# Patient Record
Sex: Female | Born: 1955 | Race: White | Hispanic: No | Marital: Married | Smoking: Former smoker
Health system: Southern US, Community
[De-identification: ages and names within clinical notes are randomized; demographics above are authoritative.]

## PROBLEM LIST (undated history)

## (undated) DIAGNOSIS — J449 Chronic obstructive pulmonary disease, unspecified: Secondary | ICD-10-CM

## (undated) DIAGNOSIS — I10 Essential (primary) hypertension: Secondary | ICD-10-CM

## (undated) HISTORY — PX: TUBAL LIGATION: SHX77

## (undated) HISTORY — PX: CHOLECYSTECTOMY: SHX55

---

## 2010-09-11 DIAGNOSIS — H6120 Impacted cerumen, unspecified ear: Secondary | ICD-10-CM | POA: Insufficient documentation

## 2010-11-24 ENCOUNTER — Encounter (HOSPITAL_BASED_OUTPATIENT_CLINIC_OR_DEPARTMENT_OTHER)
Admission: RE | Admit: 2010-11-24 | Discharge: 2010-11-24 | Disposition: A | Discharge status: Home / Self Care | Payer: BC Managed Care – PPO | Source: Ambulatory Visit | Attending: Orthopedic Surgery | Admitting: Orthopedic Surgery

## 2010-11-24 DIAGNOSIS — Z01810 Encounter for preprocedural cardiovascular examination: Secondary | ICD-10-CM | POA: Insufficient documentation

## 2010-11-24 DIAGNOSIS — Z01812 Encounter for preprocedural laboratory examination: Secondary | ICD-10-CM | POA: Insufficient documentation

## 2010-11-24 LAB — BASIC METABOLIC PANEL
CO2: 29 mEq/L (ref 19–32)
Chloride: 108 mEq/L (ref 96–112)
GFR calc Af Amer: >60 mL/min (ref >60)
Glucose, Bld: 88 mg/dL (ref 70–99)
Potassium: 4.3 mEq/L (ref 3.5–5.1)
Sodium: 142 mEq/L (ref 135–145)

## 2010-11-27 ENCOUNTER — Ambulatory Visit (HOSPITAL_BASED_OUTPATIENT_CLINIC_OR_DEPARTMENT_OTHER)
Admission: RE | Admit: 2010-11-27 | Discharge: 2010-11-27 | Disposition: A | Discharge status: Home / Self Care | Payer: BC Managed Care – PPO | Source: Ambulatory Visit | Attending: Orthopedic Surgery | Admitting: Orthopedic Surgery

## 2010-11-27 DIAGNOSIS — M6530 Trigger finger, unspecified finger: Secondary | ICD-10-CM | POA: Insufficient documentation

## 2010-11-27 DIAGNOSIS — Z01818 Encounter for other preprocedural examination: Secondary | ICD-10-CM | POA: Insufficient documentation

## 2010-12-10 NOTE — Op Note (Signed)
  NAMEBELLARAE, LIZER NO.:  [id number]  MEDICAL RECORD NO.:  [id number]          PATIENT TYPE:  LOCATION:                                 FACILITY:  PHYSICIAN:  Loreta Ave, M.D. DATE OF BIRTH:  [birth year]  DATE OF PROCEDURE:  11/27/2010 DATE OF DISCHARGE:                              OPERATIVE REPORT   PREOPERATIVE DIAGNOSIS:  Mark triggering A1 pulley, right thumb.  POSTOPERATIVE DIAGNOSIS:  Mark triggering A1 pulley, right thumb.  PROCEDURE:  Release of A1 pulley, right thumb.  SURGEON:  Loreta Ave, MD  ASSISTANT:  Genene Churn. Barry Dienes, [place]  ANESTHESIA:  General  BLOOD LOSS:  Minimal.  SPECIMEN:  None.  CULTURES:  None.  COMPLICATIONS:  None.  DRESSING:  Soft compressive.  TOURNIQUET TIME:  30 minutes.  PROCEDURE:  The patient was brought to the operating room and after adequate anesthesia had been obtained, tourniquet applied.  Prepped and draped in usual sterile fashion.  Exsanguinated with elevation, Esmarch tourniquet inflated to 250 mmHg.  Small transverse incision A1 pulley, right thumb.  Neurovascular bundles identified, protected retracted. Thickened A1 pulley released in its entirety from proximal to distal yielding a fair amount of fluid and underlying attrition in the flexor tendon, but longitudinally intact.  All triggering obliterated.  Wound copiously irrigated.  Closed with nylon.  Margins were injected with Marcaine.  Sterile compressive dressing applied.  Tourniquet inflated was removed.  Anesthesia reversed.  Brought to recovery room.  Tolerated surgery well.  No complications.     Loreta Ave, M.D.     DFM/MEDQ  D:  11/27/2010  T:  11/27/2010  Job:  [id number]  Electronically Signed by Mckinley Jewel M.D. on 12/10/2010 01:35:43 PM

## 2014-08-03 DIAGNOSIS — L989 Disorder of the skin and subcutaneous tissue, unspecified: Secondary | ICD-10-CM | POA: Insufficient documentation

## 2015-10-01 DIAGNOSIS — R918 Other nonspecific abnormal finding of lung field: Secondary | ICD-10-CM | POA: Insufficient documentation

## 2015-10-17 DIAGNOSIS — R911 Solitary pulmonary nodule: Secondary | ICD-10-CM | POA: Insufficient documentation

## 2015-10-17 DIAGNOSIS — R590 Localized enlarged lymph nodes: Secondary | ICD-10-CM | POA: Insufficient documentation

## 2015-10-25 DIAGNOSIS — M4120 Other idiopathic scoliosis, site unspecified: Secondary | ICD-10-CM | POA: Insufficient documentation

## 2015-10-25 DIAGNOSIS — G894 Chronic pain syndrome: Secondary | ICD-10-CM | POA: Insufficient documentation

## 2015-10-25 DIAGNOSIS — H5440 Blindness, one eye, unspecified eye: Secondary | ICD-10-CM | POA: Insufficient documentation

## 2015-10-25 DIAGNOSIS — M150 Primary generalized (osteo)arthritis: Secondary | ICD-10-CM | POA: Insufficient documentation

## 2015-10-25 DIAGNOSIS — R030 Elevated blood-pressure reading, without diagnosis of hypertension: Secondary | ICD-10-CM | POA: Insufficient documentation

## 2015-10-25 DIAGNOSIS — Z6826 Body mass index (BMI) 26.0-26.9, adult: Secondary | ICD-10-CM | POA: Insufficient documentation

## 2015-10-25 DIAGNOSIS — Z9181 History of falling: Secondary | ICD-10-CM | POA: Insufficient documentation

## 2015-12-23 DIAGNOSIS — R053 Chronic cough: Secondary | ICD-10-CM | POA: Insufficient documentation

## 2016-09-09 DIAGNOSIS — M79605 Pain in left leg: Secondary | ICD-10-CM | POA: Insufficient documentation

## 2017-12-14 DIAGNOSIS — M79675 Pain in left toe(s): Secondary | ICD-10-CM | POA: Insufficient documentation

## 2017-12-14 DIAGNOSIS — M79645 Pain in left finger(s): Secondary | ICD-10-CM | POA: Insufficient documentation

## 2018-11-01 ENCOUNTER — Ambulatory Visit (INDEPENDENT_AMBULATORY_CARE_PROVIDER_SITE_OTHER): Payer: BLUE CROSS/BLUE SHIELD | Admitting: Podiatry

## 2018-11-01 ENCOUNTER — Other Ambulatory Visit: Payer: Self-pay

## 2018-11-01 DIAGNOSIS — L603 Nail dystrophy: Secondary | ICD-10-CM

## 2018-11-01 DIAGNOSIS — M205X2 Other deformities of toe(s) (acquired), left foot: Secondary | ICD-10-CM | POA: Diagnosis not present

## 2018-11-01 DIAGNOSIS — M79675 Pain in left toe(s): Secondary | ICD-10-CM

## 2018-11-01 NOTE — Progress Notes (Signed)
   Subjective:    Patient ID: Jodi Curtis, female    DOB: 1955/10/28, 63 y.o.   MRN: [id number]  HPI    Review of Systems  All other systems reviewed and are negative.      Objective:   Physical Exam        Assessment & Plan:

## 2018-11-26 NOTE — Progress Notes (Signed)
  Subjective:  Patient ID: Jodi Curtis, female    DOB: Oct 30, 1955,  MRN: [id number]  Chief Complaint  Patient presents with  . Foot Pain    L hallux tip x 1 year; 3/10 intermittent pain - no injury Tx" trimming and OTC pain reliever for ingrowns -pt states her pain radiates up to her knee -worse with lots of walking    63 y.o. female presents with the above complaint.  Above history confirmed with patient   Review of Systems: Negative except as noted in the HPI. Denies N/V/F/Ch.  No past medical history on file.  Current Outpatient Medications:  .  amLODipine (NORVASC) 10 MG tablet, Take 10 mg by mouth daily., Disp: , Rfl:  .  atenolol (TENORMIN) 25 MG tablet, Take by mouth daily., Disp: , Rfl:  .  buPROPion (WELLBUTRIN) 100 MG tablet, Take 100 mg by mouth 2 (two) times daily., Disp: , Rfl:  .  citalopram (CELEXA) 10 MG tablet, Take 10 mg by mouth daily., Disp: , Rfl:  .  TRELEGY ELLIPTA 100-62.5-25 MCG/INH AEPB, , Disp: , Rfl:   Social History   Tobacco Use  Smoking Status Not on file    Allergies  Allergen Reactions  . Codeine   . Penicillins Other (See Comments)    Internal bleeding  . Sulfa Antibiotics    Objective:  There were no vitals filed for this visit. There is no height or weight on file to calculate BMI. Constitutional Well developed. Well nourished.  Vascular Dorsalis pedis pulses palpable bilaterally. Posterior tibial pulses palpable bilaterally. Capillary refill normal to all digits.  No cyanosis or clubbing noted. Pedal hair growth normal.  Neurologic Normal speech. Oriented to person, place, and time. Epicritic sensation to light touch grossly present bilaterally.  Dermatologic  left hallux nail thickened dystrophic transverse ridging No open wounds. No skin lesions.  Orthopedic: Normal joint ROM without pain or crepitus bilaterally. Left hallux cock-up deformity No bony tenderness.   Radiographs: None Assessment:   1. Acquired hallux  extensus of left foot   2. Pain around toenail, left foot   3. Nail dystrophy    Plan:  Patient was evaluated and treated and all questions answered.  Cock-up toe left hallux with nail dystrophy -Discussed that the pain in her left toe is either from the toenail or from the fact that the toe is corrected to the hallux extensors.  No pallor debrided.  Advised on shoe gear.  Follow-up for recheck in 6 weeks  Return in about 6 weeks (around 12/13/2018) for Left great toenail pain vs cock-up toe .  Will get x-rays if pain persists

## 2018-12-13 ENCOUNTER — Ambulatory Visit: Payer: BLUE CROSS/BLUE SHIELD | Admitting: Podiatry

## 2019-02-11 ENCOUNTER — Other Ambulatory Visit: Payer: Self-pay

## 2019-02-11 ENCOUNTER — Emergency Department (HOSPITAL_COMMUNITY)
Admission: EM | Admit: 2019-02-11 | Discharge: 2019-02-12 | Disposition: A | Discharge status: Home / Self Care | Payer: BLUE CROSS/BLUE SHIELD | Attending: Emergency Medicine | Admitting: Emergency Medicine

## 2019-02-11 ENCOUNTER — Encounter (HOSPITAL_COMMUNITY): Payer: Self-pay | Admitting: Emergency Medicine

## 2019-02-11 DIAGNOSIS — R0789 Other chest pain: Secondary | ICD-10-CM

## 2019-02-11 DIAGNOSIS — Z7982 Long term (current) use of aspirin: Secondary | ICD-10-CM | POA: Insufficient documentation

## 2019-02-11 DIAGNOSIS — I10 Essential (primary) hypertension: Secondary | ICD-10-CM | POA: Insufficient documentation

## 2019-02-11 DIAGNOSIS — J449 Chronic obstructive pulmonary disease, unspecified: Secondary | ICD-10-CM | POA: Insufficient documentation

## 2019-02-11 DIAGNOSIS — Z87891 Personal history of nicotine dependence: Secondary | ICD-10-CM | POA: Diagnosis not present

## 2019-02-11 DIAGNOSIS — Z79899 Other long term (current) drug therapy: Secondary | ICD-10-CM | POA: Diagnosis not present

## 2019-02-11 DIAGNOSIS — N289 Disorder of kidney and ureter, unspecified: Secondary | ICD-10-CM

## 2019-02-11 HISTORY — DX: Chronic obstructive pulmonary disease, unspecified: J44.9

## 2019-02-11 HISTORY — DX: Essential (primary) hypertension: I10

## 2019-02-11 MED ORDER — SODIUM CHLORIDE 0.9% FLUSH: 3.0000 mL | Freq: Once | INTRAVENOUS | Status: DC

## 2019-02-11 NOTE — ED Triage Notes (Signed)
Pt c/o left side cp for the past few hours that started while she was doing laundry. Pt states she has a Hx of COPD, so she is always SOB, denies any nausea or vomiting.

## 2019-02-12 ENCOUNTER — Emergency Department (HOSPITAL_COMMUNITY): Payer: BLUE CROSS/BLUE SHIELD

## 2019-02-12 LAB — BASIC METABOLIC PANEL
Anion gap: 10 (ref 5–15)
BUN: 16 mg/dL (ref 8–23)
CO2: 27 mmol/L (ref 22–32)
Calcium: 10.2 mg/dL (ref 8.9–10.3)
Chloride: 103 mmol/L (ref 98–111)
Creatinine, Ser: 1.1 mg/dL — ABNORMAL HIGH (ref 0.44–1.00)
GFR calc Af Amer: >60 mL/min (ref >60)
GFR calc non Af Amer: 54 mL/min — ABNORMAL LOW (ref >60)
Glucose, Bld: 83 mg/dL (ref 70–99)
Potassium: 4.5 mmol/L (ref 3.5–5.1)
Sodium: 140 mmol/L (ref 135–145)

## 2019-02-12 LAB — CBC
HCT: 45.8 % (ref 36.0–46.0)
Hemoglobin: 14.9 g/dL (ref 12.0–15.0)
MCH: 29.6 pg (ref 26.0–34.0)
MCHC: 32.5 g/dL (ref 30.0–36.0)
MCV: 90.9 fL (ref 80.0–100.0)
Platelets: 208 10*3/uL (ref 150–400)
RBC: 5.04 MIL/uL (ref 3.87–5.11)
RDW: 13.4 % (ref 11.5–15.5)
WBC: 13.3 10*3/uL — ABNORMAL HIGH (ref 4.0–10.5)
nRBC: 0 % (ref 0.0–0.2)

## 2019-02-12 LAB — TROPONIN I
Troponin I: <0.03 ng/mL (ref <0.03)
Troponin I: <0.03 ng/mL (ref <0.03)

## 2019-02-12 MED ORDER — ALBUTEROL SULFATE HFA 108 (90 BASE) MCG/ACT IN AERS
4.0000 | INHALATION_SPRAY | Freq: Once | RESPIRATORY_TRACT | Status: AC
  Administered 2019-02-12: 4 via RESPIRATORY_TRACT
  Filled 2019-02-12: qty 6.7

## 2019-02-12 MED ORDER — IPRATROPIUM BROMIDE HFA 17 MCG/ACT IN AERS
2.0000 | INHALATION_SPRAY | Freq: Once | RESPIRATORY_TRACT | Status: AC
  Administered 2019-02-12: 02:00:00 2 via RESPIRATORY_TRACT
  Filled 2019-02-12: qty 12.9

## 2019-02-12 NOTE — ED Provider Notes (Signed)
[facility] EMERGENCY DEPARTMENT Provider Note  CSN: [id number] Arrival date & time: 02/11/19 2330  Chief Complaint(s) Chest Pain  HPI Imagin Jodi Curtis is a 63 y.o. female with a history of hypertension COPD who presents to the emergency department with several hours of mild left-sided chest soreness.  Pain is nonexertional and nonradiating.  She reports that it began while she was doing laundry.  States that she was bending down to get things out of the dryer.  When she stood back up, she felt a "warmness of electricity" from her waist up to her head.  This was associated with mild lightheadedness.  Episode lasted several seconds and quickly and spontaneously resolved.  Patient denies any worsening of her baseline shortness of breath.  No associated nausea or vomiting.  No abdominal pain.  Patient reports being treated for COPD exacerbation last week and finishing up her course of steroids.  Denies any recent fevers or infections.  HPI  Past Medical History Past Medical History:  Diagnosis Date  . COPD (chronic obstructive pulmonary disease) (HCC)   . Hypertension    There are no active problems to display for this patient.  Home Medication(s) Prior to Admission medications   Medication Sig Start Date End Date Taking? Authorizing Provider  amLODipine (NORVASC) 10 MG tablet Take 10 mg by mouth daily.   Yes [provider]  aspirin EC 81 MG tablet Take 81 mg by mouth daily.   Yes [provider]  atenolol (TENORMIN) 25 MG tablet Take 25 mg by mouth daily.    Yes [provider]  buPROPion (WELLBUTRIN) 100 MG tablet Take 100 mg by mouth 2 (two) times daily.   Yes [provider]  citalopram (CELEXA) 10 MG tablet Take 10 mg by mouth daily.   Yes [provider]  TRELEGY ELLIPTA 100-62.5-25 MCG/INH AEPB Inhale 1 puff into the lungs daily.  10/23/18  Yes [provider]                                                                                Past Surgical History Past Surgical History:  Procedure Laterality Date  . CHOLECYSTECTOMY    . TUBAL LIGATION     Family History History reviewed. No pertinent family history.  Social History Social History   Tobacco Use  . Smoking status: Former Games developer  . Smokeless tobacco: Never Used  Substance Use Topics  . Alcohol use: Never    Frequency: Never  . Drug use: Never   Allergies Codeine; Penicillins; and Sulfa antibiotics  Review of Systems Review of Systems All other systems are reviewed and are negative for acute change except as noted in the HPI  Physical Exam Vital Signs  I have reviewed the triage vital signs BP 102/78 (BP Location: Right Arm)   Pulse 60   Temp 98.2 F (36.8 C) (Oral)   Resp 18   Ht 4\' 10"  (1.473 m)   Wt 46.7 kg   SpO2 92%   BMI 21.53 kg/m   Physical Exam Vitals signs reviewed.  Constitutional:      General: She is not in acute distress.    Appearance: She is well-developed. She is  not diaphoretic.  HENT:     Head: Normocephalic and atraumatic.     Nose: Nose normal.  Eyes:     General: No scleral icterus.       Right eye: No discharge.        Left eye: No discharge.     Conjunctiva/sclera: Conjunctivae normal.     Pupils: Pupils are equal, round, and reactive to light.  Neck:     Musculoskeletal: Normal range of motion and neck supple.  Cardiovascular:     Rate and Rhythm: Normal rate and regular rhythm.     Heart sounds: No murmur. No friction rub. No gallop.   Pulmonary:     Effort: Pulmonary effort is normal. No respiratory distress.     Breath sounds: No stridor. Wheezing (faint exp wheezing, throughout) present. No rales.  Abdominal:     General: There is no distension.     Palpations: Abdomen is soft.     Tenderness: There is no abdominal tenderness.  Musculoskeletal:        General: No tenderness.  Skin:    General: Skin is warm and dry.      Findings: No erythema or rash.  Neurological:     Mental Status: She is alert and oriented to person, place, and time.     ED Results and Treatments Labs (all labs ordered are listed, but only abnormal results are displayed) Labs Reviewed  BASIC METABOLIC PANEL - Abnormal; Notable for the following components:      Result Value   Creatinine, Ser 1.10 (*)    GFR calc non Af Amer 54 (*)    All other components within normal limits  CBC - Abnormal; Notable for the following components:   WBC 13.3 (*)    All other components within normal limits  TROPONIN I  TROPONIN I                                                                                                                         EKG  EKG Interpretation  Date/Time:  Sunday Feb 12 2019 00:49:42 EDT Ventricular Rate:  47 PR Interval:    QRS Duration: 86 QT Interval:  474 QTC Calculation: 420 R Axis:   82 Text Interpretation:  Sinus bradycardia Short PR interval Borderline right axis deviation Borderline T abnormalities, anterior leads Otherwise no significant change Confirmed by Drema Pryardama, Bryar Dahms [phone]([id number]) on 02/12/2019 1:19:41 AM      Radiology Dg Chest 2 View  Result Date: 02/12/2019 CLINICAL DATA:  Chest pain EXAM: CHEST - 2 VIEW COMPARISON:  None. FINDINGS: The lungs are hyperinflated with diffuse interstitial prominence. No focal airspace consolidation or pulmonary edema. No pleural effusion or pneumothorax. Normal cardiomediastinal contours. IMPRESSION: COPD without acute airspace disease. Electronically Signed   By: Deatra RobinsonKevin  Herman M.D.   On: 02/12/2019 00:50   Pertinent labs & imaging results that were available during my care of the patient were reviewed by me and considered in my medical decision  making (see chart for details).  Medications Ordered in ED Medications  sodium chloride flush (NS) 0.9 % injection 3 mL (0 mLs Intravenous Hold 02/12/19 0224)  albuterol (VENTOLIN HFA) 108 (90 Base) MCG/ACT inhaler 4 puff (4  puffs Inhalation Given 02/12/19 0223)  ipratropium (ATROVENT HFA) inhaler 2 puff (2 puffs Inhalation Given 02/12/19 0224)                                                                                                                                    Procedures Procedures  (including critical care time)  Medical Decision Making / ED Course I have reviewed the nursing notes for this encounter and the patient's prior records (if available in EHR or on provided paperwork).    Atypical chest pain.  EKG without acute ischemic changes or evidence of pericarditis.  Initial troponin negative.  Heart score less than 4.  Appropriate for delta troponin.  Low suspicion for pulmonary embolism.  Presentation not classic for aortic dissection or esophageal perforation.  Chest x-ray without evidence suggestive of pneumonia, pneumothorax, pneumomediastinum.  No abnormal contour of the mediastinum to suggest dissection. No evidence of acute injuries.  Orthostatics obtained given the positional nature of the onset, which were stable.  Labs with mild leukocytosis likely from recent steroid use.  BMP did reveal mild renal insufficiency.  No significant electrolyte derangements. Hydrated orally.  Delta trop negative.  The patient appears reasonably screened and/or stabilized for discharge and I doubt any other medical condition or other [facility] requiring further screening, evaluation, or treatment in the ED at this time prior to discharge.  The patient is safe for discharge with strict return precautions.   Final Clinical Impression(s) / ED Diagnoses Final diagnoses:  Atypical chest pain  Mild renal insufficiency    Disposition: Discharge  Condition: Good  I have discussed the results, Dx and Tx plan with the patient who expressed understanding and agree(s) with the plan. Discharge instructions discussed at great length. The patient was given strict return precautions who verbalized understanding of the  instructions. No further questions at time of discharge.    ED Discharge Orders    None       Follow Up: Dema Severin, NP [address] [place] [address] [phone]  Schedule an appointment as soon as possible for a visit      This chart was dictated using voice recognition software.  Despite best efforts to proofread,  errors can occur which can change the documentation meaning.   Nira Conn, MD 02/12/19 [phone]

## 2019-02-12 NOTE — ED Notes (Signed)
Natalia Leatherwood and Weston Brass pts son and wife [phone]

## 2019-02-12 NOTE — ED Notes (Signed)
Pt having no pain  Alert oriented

## 2019-03-14 ENCOUNTER — Ambulatory Visit (INDEPENDENT_AMBULATORY_CARE_PROVIDER_SITE_OTHER): Payer: BC Managed Care – PPO | Admitting: Internal Medicine

## 2019-03-14 ENCOUNTER — Encounter: Payer: Self-pay | Admitting: Internal Medicine

## 2019-03-14 DIAGNOSIS — J441 Chronic obstructive pulmonary disease with (acute) exacerbation: Secondary | ICD-10-CM | POA: Insufficient documentation

## 2019-03-14 DIAGNOSIS — R0902 Hypoxemia: Secondary | ICD-10-CM | POA: Insufficient documentation

## 2019-03-14 DIAGNOSIS — J449 Chronic obstructive pulmonary disease, unspecified: Secondary | ICD-10-CM | POA: Insufficient documentation

## 2019-03-14 HISTORY — DX: Hypoxemia: R09.02

## 2019-03-14 HISTORY — DX: Chronic obstructive pulmonary disease with (acute) exacerbation: J44.1

## 2019-03-14 MED ORDER — SPIRIVA RESPIMAT 2.5 MCG/ACT IN AERS: 2.0000 | INHALATION_SPRAY | Freq: Every day | RESPIRATORY_TRACT | 0 refills | Status: DC

## 2019-03-14 MED ORDER — BUDESONIDE-FORMOTEROL FUMARATE 160-4.5 MCG/ACT IN AERO: INHALATION_SPRAY | RESPIRATORY_TRACT | 12 refills | Status: DC

## 2019-03-14 MED ORDER — PREDNISONE 10 MG PO TABS: ORAL_TABLET | ORAL | 0 refills | Status: DC

## 2019-03-14 MED ORDER — SPIRIVA RESPIMAT 2.5 MCG/ACT IN AERS: INHALATION_SPRAY | RESPIRATORY_TRACT | 11 refills | Status: DC

## 2019-03-14 MED ORDER — BUDESONIDE-FORMOTEROL FUMARATE 160-4.5 MCG/ACT IN AERO: 2.0000 | INHALATION_SPRAY | Freq: Two times a day (BID) | RESPIRATORY_TRACT | 0 refills | Status: DC

## 2019-03-14 NOTE — Progress Notes (Signed)
[name], female    DOB: 05/22/1956,    MRN: [id number]   Brief patient profile:  [id number] yowf completely quit all smoking(cigs and e cigs)  12/2018 on trelegy maint x 2016 but worse doe x 12/2018 and only really better with prednisone so referred to pulmonary clinic 03/14/2019 by Mauricio Poegina York with presumed copd     History of Present Illness  03/14/2019  Pulmonary/ 1st office eval/Unnamed Zeien  Chief Complaint  Patient presents with   Pulmonary Consult    Referred by Mauricio Poegina York, NP for eval of COPD. Pt c/o SOB "walking a pretty good ways". She uses her albuterol inhaler about 2 x per day.  Dyspnea:  [facility] sob= MMRC3 = can't walk 100 yards even at a slow pace at a flat grade s stopping due to sob  / much better p prednisone rx  Cough: none at all now Sleep: no trouble ok flat  SABA use: twice daily avg    No obvious day to day or daytime variability or assoc excess/ purulent sputum or mucus plugs or hemoptysis or cp or chest tightness, subjective wheeze or overt sinus or hb symptoms.   Sleeping as above without nocturnal  or early am exacerbation  of respiratory  c/o's or need for noct saba. Also denies any obvious fluctuation of symptoms with weather or environmental changes or other aggravating or alleviating factors except as outlined above   No unusual exposure hx or h/o childhood pna/ asthma or knowledge of premature birth.  Current Allergies, Complete Past Medical History, Past Surgical History, Family History, and Social History were reviewed in [facility] electronic [facility] record.  ROS  The following are not active complaints unless bolded Hoarseness, sore throat, dysphagia, dental problems, itching, sneezing,  nasal congestion or discharge of excess mucus or purulent secretions, ear ache,   fever, chills, sweats, unintended wt loss or wt gain, classically pleuritic or exertional cp,  orthopnea pnd or arm/hand swelling  or leg swelling,  presyncope, palpitations, abdominal pain, anorexia, nausea, vomiting, diarrhea  or change in bowel habits or change in bladder habits, change in stools or change in urine, dysuria, hematuria,  rash, arthralgias, visual complaints, headache, numbness, weakness or ataxia or problems with walking or coordination,  change in mood or  memory.           Past Medical History:  Diagnosis Date   COPD (chronic obstructive pulmonary disease) (HCC)    Hypertension     Outpatient Medications Prior to Visit  Medication Sig Dispense Refill   Albuterol Sulfate (PROAIR RESPICLICK) 108 (90 Base) MCG/ACT AEPB Inhale 2 puffs into the lungs every 4 (four) hours as needed.     amLODipine (NORVASC) 10 MG tablet Take 10 mg by mouth daily.     aspirin EC 81 MG tablet Take 81 mg by mouth daily.     atenolol (TENORMIN) 25 MG tablet Take 25 mg by mouth daily.      buPROPion (WELLBUTRIN) 100 MG tablet Take 100 mg by mouth 2 (two) times daily.     citalopram (CELEXA) 10 MG tablet Take 10 mg by mouth daily.            TRELEGY ELLIPTA 100-62.5-25 MCG/INH AEPB Inhale 1 puff into the lungs daily.         Objective:     BP 132/80 (BP Location: Left Arm, Cuff Size: Normal)    Pulse 70    Temp 98.4  F (36.9 C) (Oral)    Ht 4\' 9"  (1.448 m)    Wt 106 lb (48.1 kg)    SpO2 90%    BMI 22.94 kg/m   SpO2: 90 % RA   HEENT: Upper dentures/ nl  oropharynx. Nl external ear canals without cough reflex -  Mild bilateral non-specific turbinate edema     NECK :  without JVD/Nodes/TM/ nl carotid upstrokes bilaterally   LUNGS: no acc muscle use,  Mod barrel  contour chest wall with bilateral  Distant bs s audible wheeze and  without cough on insp or exp maneuver and mod  Hyperresonant  to  percussion bilaterally     CV:  RRR  no s3 or murmur or increase in P2, and no edema   ABD:  soft and nontender with pos mid  insp Hoover's  in the supine position. No bruits or organomegaly appreciated, bowel sounds nl  MS:    Nl gait/  ext warm without deformities, calf tenderness, cyanosis or clubbing No obvious joint restrictions   SKIN: warm and dry without lesions    NEURO:  alert, approp, nl sensorium with  no motor or cerebellar deficits apparent.         I personally reviewed images and agree with radiology impression as follows:  CXR:   02/12/2019 COPD without acute airspace disease.   Labs ordered 03/14/2019  Alpha one  AT screen     Assessment   COPD mixed type ([place]) Quit all smoking 12/2018  - alpha one AT screen 03/14/2019  - 03/14/2019  After extensive coaching inhaler device,  effectiveness =    75% with hfa and smi > changed trelegy to symb/spiriva smi   DDX of  difficult airways management almost all start with A and  include Adherence, Ace Inhibitors, Acid Reflux, Active Sinus Disease, Alpha 1 Antitripsin deficiency, Anxiety masquerading as Airways dz,  ABPA,  Allergy(esp in young), Aspiration (esp in elderly), Adverse effects of meds,  Active smoking or vaping, A bunch of PE's (a small clot burden can't cause this syndrome unless there is already severe underlying pulm or vascular dz with poor reserve) plus two Bs  = Bronchiectasis and Beta blocker use..and one C= CHF   Adherence is always the initial "prime suspect" and is a multilayered concern that requires a "trust but verify" approach in every patient - starting with knowing how to use medications, especially inhalers, correctly, keeping up with refills and understanding the fundamental difference between maintenance and prns vs those medications only taken for a very short course and then stopped and not refilled.  - see device teaching - return with all meds in hand using a trust but verify approach to confirm accurate Medication  Reconciliation The principal here is that until we are certain that the  patients are doing what we've asked, it makes no sense to ask them to do more.    ? Allergy/asthma component > Prednisone 10 mg take  4  each am x 2 days,   2 each am x 2 days,  1 each am x 2 days and stop   ? Alpha one AT def > screening sent   ? Adverse effects of dpi > try off trelegy   ? Anxiety > usually at the bottom of this list of usual suspects but should be included on this pt's based on Hx and note already on psychotropics and may interfere with adherence and also interpretation of response or lack thereof to symptom management  which can be quite subjective.   ? Beta blocker effects > unlikely with low dose tenormin   ? chf > nothing to suggest    Overall pattern :  Group D in terms of symptom/risk and laba/lama/ICS  therefore appropriate rx at this point >>>  Try samples of symb 160/ spiriva smi 2.5 and regroup in 6 weeks    Exercise hypoxemia 03/14/2019   Walked RA x one lap =  approx 250 ft - stopped due to  Sob/ desats to 84% at moderate pace   Based on the most recent guidelines in stable copd no need to start amb 02 at this point > will try to optimize copd rx first (see separate a/p)    Total time devoted to counseling  > 50 % of initial 60 min office visit:  reviewed case with pt/ directly observed portions of ambulatory 02 saturation study/  device teaching done both of  which extended face to face time for this visit  discussion of options/alternatives/ personally creating written customized instructions  in presence of pt  then going over those specific  Instructions directly with the pt including how to use all of the meds but in particular covering each new medication in detail and the difference between the maintenance= "automatic" meds and the prns using an action plan format for the latter (If this problem/symptom => do that organization reading Left to right).  Please see AVS from this visit for a full list of these instructions which I personally wrote for this pt and  are unique to this visit.    Sandrea HughsMichael Chief Walkup, MD 03/14/2019

## 2019-03-14 NOTE — Assessment & Plan Note (Signed)
03/14/2019   Walked RA x one lap =  approx 250 ft - stopped due to  Sob/ desats to 84% at moderate pace   Based on the most recent guidelines in stable copd no need to start amb 02 at this point > will try to optimize copd rx first (see separate a/p)    Total time devoted to counseling  > 50 % of initial 60 min office visit:  reviewed case with pt/ directly observed portions of ambulatory 02 saturation study/  device teaching done both of  which extended face to face time for this visit  discussion of options/alternatives/ personally creating written customized instructions  in presence of pt  then going over those specific  Instructions directly with the pt including how to use all of the meds but in particular covering each new medication in detail and the difference between the maintenance= "automatic" meds and the prns using an action plan format for the latter (If this problem/symptom => do that organization reading Left to right).  Please see AVS from this visit for a full list of these instructions which I personally wrote for this pt and  are unique to this visit.

## 2019-03-14 NOTE — Assessment & Plan Note (Addendum)
Quit all smoking 12/2018  - alpha one AT screen 03/14/2019  - 03/14/2019  After extensive coaching inhaler device,  effectiveness =    75% with hfa and smi > changed trelegy to symb/spiriva smi   DDX of  difficult airways management almost all start with A and  include Adherence, Ace Inhibitors, Acid Reflux, Active Sinus Disease, Alpha 1 Antitripsin deficiency, Anxiety masquerading as Airways dz,  ABPA,  Allergy(esp in young), Aspiration (esp in elderly), Adverse effects of meds,  Active smoking or vaping, A bunch of PE's (a small clot burden can't cause this syndrome unless there is already severe underlying pulm or vascular dz with poor reserve) plus two Bs  = Bronchiectasis and Beta blocker use..and one C= CHF   Adherence is always the initial "prime suspect" and is a multilayered concern that requires a "trust but verify" approach in every patient - starting with knowing how to use medications, especially inhalers, correctly, keeping up with refills and understanding the fundamental difference between maintenance and prns vs those medications only taken for a very short course and then stopped and not refilled.  - see device teaching - return with all meds in hand using a trust but verify approach to confirm accurate Medication  Reconciliation The principal here is that until we are certain that the  patients are doing what we've asked, it makes no sense to ask them to do more.    ? Allergy/asthma component > Prednisone 10 mg take  4 each am x 2 days,   2 each am x 2 days,  1 each am x 2 days and stop   ? Alpha one AT def > screening sent   ? Adverse effects of dpi > try off trelegy   ? Anxiety > usually at the bottom of this list of usual suspects but should be included on this pt's based on Hx and note already on psychotropics and may interfere with adherence and also interpretation of response or lack thereof to symptom management which can be quite subjective.   ? Beta blocker effects >  unlikely with low dose tenormin   ? chf > nothing to suggest    Overall pattern :  Group D in terms of symptom/risk and laba/lama/ICS  therefore appropriate rx at this point >>>  Try samples of symb 160/ spiriva smi 2.5 and regroup in 6 weeks

## 2019-03-14 NOTE — Patient Instructions (Addendum)
Plan A = Automatic = symbicort 160 Take 2 puffs first thing in am and then another 2 puffs about 12 hours later.  And spiriva 2 puffs each am    Plan B = Backup Only use your albuterol inhaler as a rescue medication to be used if you can't catch your breath by resting or doing a relaxed purse lip breathing pattern.  - The less you use it, the better it will work when you need it. - Ok to use the inhaler up to 2 puffs  every 4 hours if you must but call for appointment if use goes up over your usual need - Don't leave home without it !!  (think of it like the spare tire for your car)    Prednisone 10 mg take  4 each am x 2 days,   2 each am x 2 days,  1 each am x 2 days and stop    Please remember to go to the lab department   for your tests - we will call you with the results when they are available.      Please schedule a follow up office visit in 6 weeks, call sooner if needed with all medications /inhalers/ solutions in hand so we can verify exactly what you are taking. This includes all medications from all doctors and over the counters

## 2019-03-18 LAB — ALPHA-1 ANTITRYPSIN PHENOTYPE: A-1 Antitrypsin, Ser: 99 mg/dL (ref 83–199)

## 2019-03-20 ENCOUNTER — Encounter: Payer: Self-pay | Admitting: *Deleted

## 2019-03-20 NOTE — Progress Notes (Signed)
LMTCB

## 2019-03-21 ENCOUNTER — Telehealth: Payer: Self-pay | Admitting: Internal Medicine

## 2019-03-21 NOTE — Telephone Encounter (Signed)
Spoke with patient. She was returning the call in regards to her lab work. She verbalized understanding. Nothing further needed at time of call.

## 2019-04-04 ENCOUNTER — Ambulatory Visit: Payer: BC Managed Care – PPO | Admitting: Cardiology

## 2019-04-04 DIAGNOSIS — Z79899 Other long term (current) drug therapy: Secondary | ICD-10-CM | POA: Insufficient documentation

## 2019-04-07 DIAGNOSIS — M5116 Intervertebral disc disorders with radiculopathy, lumbar region: Secondary | ICD-10-CM | POA: Insufficient documentation

## 2019-04-25 ENCOUNTER — Telehealth: Payer: Self-pay | Admitting: Internal Medicine

## 2019-04-25 NOTE — Telephone Encounter (Signed)
Called and spoke with pt. Pt said that her grandaughter was just tested for covid today 7/28 due to not feeling well having no appetite and fatigue.  Pt said the last time she was around her granddaughter was Friday, 7/24. Pt does not have any covid symptoms (no worsening SOB, no fever, no loss of taste or smell, no nausea/vomiting). Pt is scheduled for an appt tomorrow, 7/29 with MW and is wanting to know if it is okay for her to still come to her appt or if the appt needs to be rescheduled.  Dr. Melvyn Novas, please advise on this for pt. Thanks!

## 2019-04-25 NOTE — Telephone Encounter (Signed)
Called and spoke with pt stating to her the info per MW. Pt said she would rather go ahead and reschedule her appt not knowing the results of her grandaughter's covid test. appt has been changed to [place]. 8/7. Nothing further needed.

## 2019-04-25 NOTE — Telephone Encounter (Signed)
Ok to come in with strict masking C side or altenative is stay quarantined until results back but from "sick contact" and reschedule ov - would do the latter if she's feeling better vs prior ov and is just a recheck anyway - if not doing better we need to see her

## 2019-04-26 ENCOUNTER — Ambulatory Visit: Payer: BC Managed Care – PPO | Admitting: Internal Medicine

## 2019-05-05 ENCOUNTER — Ambulatory Visit: Payer: BC Managed Care – PPO | Admitting: Internal Medicine

## 2019-05-09 ENCOUNTER — Ambulatory Visit (INDEPENDENT_AMBULATORY_CARE_PROVIDER_SITE_OTHER): Payer: BC Managed Care – PPO | Admitting: Internal Medicine

## 2019-05-09 ENCOUNTER — Other Ambulatory Visit: Payer: Self-pay

## 2019-05-09 ENCOUNTER — Encounter: Payer: Self-pay | Admitting: Internal Medicine

## 2019-05-09 VITALS — BP 110/70 | HR 72 | Temp 98.0°F | Ht <= 58 in | Wt 104.0 lb

## 2019-05-09 DIAGNOSIS — J449 Chronic obstructive pulmonary disease, unspecified: Secondary | ICD-10-CM | POA: Diagnosis not present

## 2019-05-09 DIAGNOSIS — R0902 Hypoxemia: Secondary | ICD-10-CM

## 2019-05-09 NOTE — Patient Instructions (Signed)
No change in medications  Please schedule a follow up visit in 3 months but call sooner if needed with pfts on return  

## 2019-05-09 NOTE — Progress Notes (Signed)
Jodi Curtis, female    DOB: 1956-01-31,    MRN: [id number]   Brief patient profile:  30 yowf SS with nl levels of Alpha one AT completely quit all smoking(cigs and e cigs)  12/2018 on trelegy maint x 2016 but worse doe x 12/2018 and only really better with prednisone so referred to pulmonary clinic 03/14/2019 by Heide Scales with presumed copd     History of Present Illness  03/14/2019  Pulmonary/ 1st office eval/Amiri Tritch  Chief Complaint  Patient presents with  . Pulmonary Consult    Referred by Heide Scales, NP for eval of COPD. Pt c/o SOB "walking a pretty good ways". She uses her albuterol inhaler about 2 x per day.  Dyspnea:  Walks 200 ft to freezer at [facility] and back to deli sob= MMRC3 = can't walk 100 yards even at a slow pace at a flat grade s stopping due to sob  / much better p prednisone rx  Cough: none at all now Sleep: no trouble ok flat  SABA use: twice daily avg   rec Plan A = Automatic = symbicort 160 Take 2 puffs first thing in am and then another 2 puffs about 12 hours later.  And spiriva 2 puffs each am  Plan B = Backup Only use your albuterol inhaler    05/09/2019  f/u ov/[name] re: presumed copd/ pfts pending  Chief Complaint  Patient presents with  . Follow-up    Breathing is "maybe a little better". She is using her albuterol inhaler 4 x per wk on average.   Dyspnea: MMRC3 = can't walk 100 yards even at a slow pace at a flat grade s stopping due to sob   But able to half way back from freezer at [facility] which is an improvement  Cough: none  Sleeping: ok flat  SABA use: as above= only  when over does  02: none   No obvious day to day or daytime variability or assoc excess/ purulent sputum or mucus plugs or hemoptysis or cp or chest tightness, subjective wheeze or overt sinus or hb symptoms.   Sleeping as above without nocturnal  or early am exacerbation  of respiratory  c/o's or need for noct saba. Also denies any obvious fluctuation of symptoms with weather or environmental  changes or other aggravating or alleviating factors except as outlined above   No unusual exposure hx or h/o childhood pna/ asthma or knowledge of premature birth.  Current Allergies, Complete Past Medical History, Past Surgical History, Family History, and Social History were reviewed in [employer] record.  ROS  The following are not active complaints unless bolded Hoarseness, sore throat, dysphagia, dental problems, itching, sneezing,  nasal congestion or discharge of excess mucus or purulent secretions, ear ache,   fever, chills, sweats, unintended wt loss or wt gain, classically pleuritic or exertional cp,  orthopnea pnd or arm/hand swelling  or leg swelling, presyncope, palpitations, abdominal pain, anorexia, nausea, vomiting, diarrhea  or change in bowel habits or change in bladder habits, change in stools or change in urine, dysuria, hematuria,  rash, arthralgias, visual complaints, headache, numbness, weakness or ataxia or problems with walking or coordination,  change in mood or  memory.        Current Meds  Medication Sig  . amLODipine (NORVASC) 10 MG tablet Take 10 mg by mouth daily.  Marland Kitchen aspirin EC 81 MG tablet Take 81 mg by mouth daily.  Marland Kitchen atenolol (TENORMIN) 25 MG tablet Take 25  mg by mouth daily.   . budesonide-formoterol (SYMBICORT) 160-4.5 MCG/ACT inhaler Take 2 puffs first thing in am and then another 2 puffs about 12 hours later.  Marland Kitchen. buPROPion (WELLBUTRIN) 100 MG tablet Take 100 mg by mouth 2 (two) times daily.  . citalopram (CELEXA) 10 MG tablet Take 10 mg by mouth daily.  . Tiotropium Bromide Monohydrate (SPIRIVA RESPIMAT) 2.5 MCG/ACT AERS 2 puffs each am  . VENTOLIN HFA 108 (90 Base) MCG/ACT inhaler Inhale 2 puffs into the lungs every 4 (four) hours as needed.                      Objective:     amb wf nad  Wt Readings from Last 3 Encounters:  05/09/19 104 lb (47.2 kg)  03/14/19 106 lb (48.1 kg)  02/11/19 103 lb (46.7 kg)     Vital  signs reviewed - Note on arrival 02 sats  96% on RA         HEENT:  Upper dentures/ nl oropharynx. Nl external ear canals without cough reflex -  Mild bilateral non-specific turbinate edema     NECK :  without JVD/Nodes/TM/ nl carotid upstrokes bilaterally   LUNGS: no acc muscle use,  Mod barrel  contour chest wall with bilateral  Distant bs s audible wheeze and  without cough on insp or exp maneuver and mod  Hyperresonant  to  percussion bilaterally     CV:  RRR  no s3 or murmur or increase in P2, and no edema   ABD:  soft and nontender with pos mid insp Hoover's  in the supine position. No bruits or organomegaly appreciated, bowel sounds nl  MS:     ext warm without deformities, calf tenderness, cyanosis or clubbing No obvious joint restrictions   SKIN: warm and dry without lesions    NEURO:  alert, approp, nl sensorium with  no motor or cerebellar deficits apparent.                 Assessment

## 2019-05-10 ENCOUNTER — Encounter: Payer: Self-pay | Admitting: Internal Medicine

## 2019-05-10 NOTE — Assessment & Plan Note (Addendum)
03/14/2019   desats Walking RA x one lap =  approx 250 ft - stopped due to  Sob/ desats to 84% at moderate pace   Clinically improving, advised to monitor sats at peak activity levels with goal of keeping > 90% by pacing.   I had an extended discussion with the patient reviewing all relevant studies completed to date and  lasting 15 to 20 minutes of a 25 minute visit    I performed detailed device teaching using a teach back method which extended face to face time for this visit (see above)  Each maintenance medication was reviewed in detail including emphasizing most importantly the difference between maintenance and prns and under what circumstances the prns are to be triggered using an action plan format that is not reflected in the computer generated alphabetically organized AVS which I have not found useful in most complex patients, especially with respiratory illnesses  Please see AVS for specific instructions unique to this visit that I personally wrote and verbalized to the the pt in detail and then reviewed with pt  by my nurse highlighting any  changes in therapy recommended at today's visit to their plan of care.

## 2019-05-10 NOTE — Assessment & Plan Note (Addendum)
Quit all smoking 12/2018  - alpha one AT screen 03/14/2019   SS   Level 99  - 03/14/2019    changed trelegy to symb/spiriva smi  - 05/09/2019  After extensive coaching inhaler device,  effectiveness =    75% hfa and smi   Probably  Group D in terms of symptom/risk and laba/lama/ICS  therefore appropriate rx at this point >>>  Continue symb/ spiriva, consider triple hfa - Breztree when released this fall   No change needed in meantime   I spent extra time with pt today reviewing appropriate use of albuterol for prn use on exertion with the following points: 1) saba is for relief of sob that does not improve by walking a slower pace or resting but rather if the pt does not improve after trying this first. 2) If the pt is convinced, as many are, that saba helps recover from activity faster then it's easy to tell if this is the case by re-challenging : ie stop, take the inhaler, then p 5 minutes try the exact same activity (intensity of workload) that just caused the symptoms and see if they are substantially diminished or not after saba 3) if there is an activity that reproducibly causes the symptoms, try the saba 15 min before the activity on alternate days   If in fact the saba really does help, then fine to continue to use it prn but advised may need to look closer at the maintenance regimen being used to achieve better control of airways disease with exertion.    >>> return 3 m with pfts

## 2019-07-29 IMAGING — CR CHEST - 2 VIEW
2 series · 2 of 2 positions shown · non-contrast
Comparison: None.

CLINICAL DATA: Chest pain

EXAM:
CHEST - 2 VIEW

[chest pa]
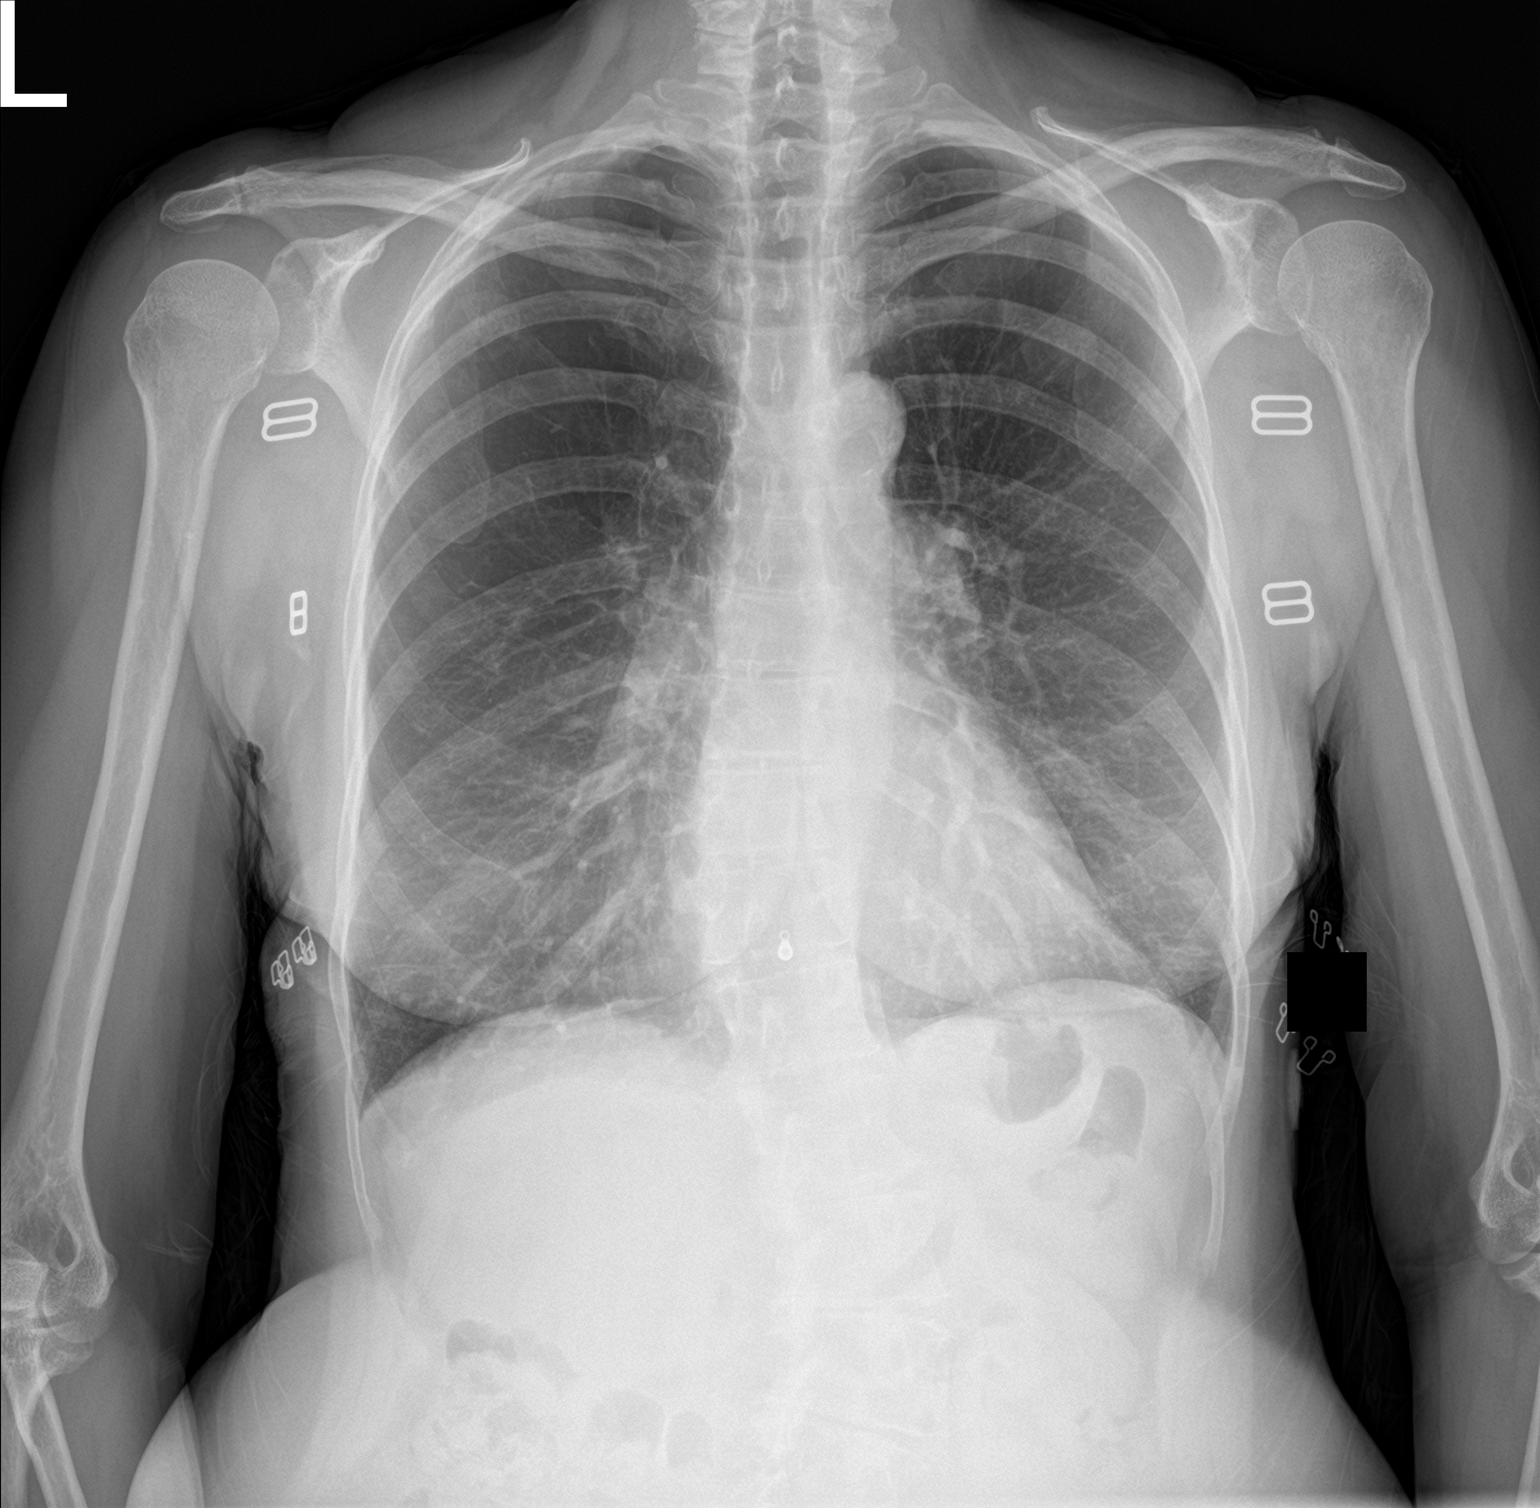

[chest lat]
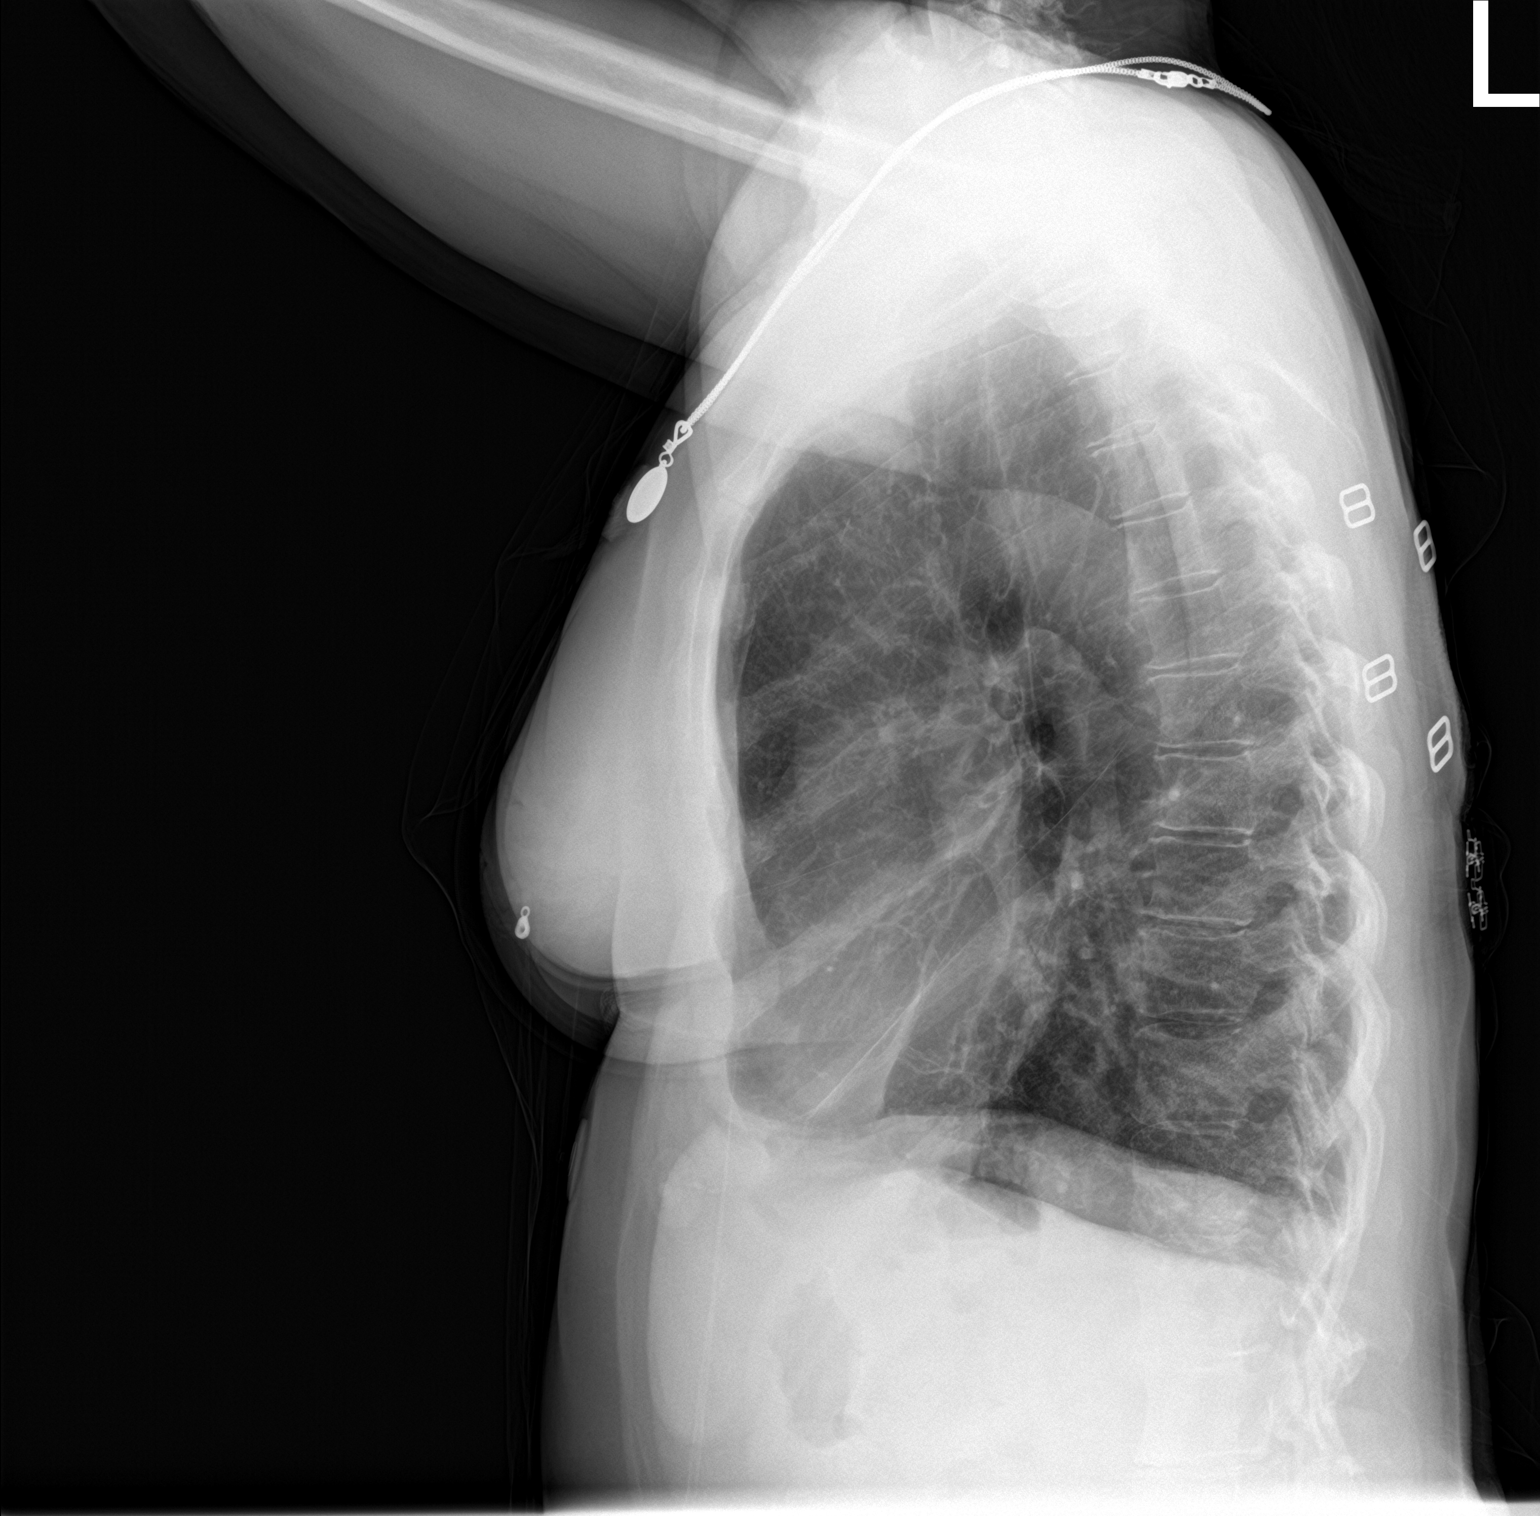

[2 of 2 positions shown; findings below may reference images not displayed]

FINDINGS: The lungs are hyperinflated with diffuse interstitial prominence. No
focal airspace consolidation or pulmonary edema. No pleural effusion
or pneumothorax. Normal cardiomediastinal contours.
IMPRESSION: COPD without acute airspace disease.

## 2019-08-11 ENCOUNTER — Inpatient Hospital Stay (HOSPITAL_COMMUNITY): Admission: RE | Admit: 2019-08-11 | Payer: BC Managed Care – PPO | Source: Ambulatory Visit

## 2019-08-11 ENCOUNTER — Telehealth: Payer: Self-pay | Admitting: Internal Medicine

## 2019-08-11 NOTE — Telephone Encounter (Signed)
Printed & placed in folder to sched.

## 2019-08-14 ENCOUNTER — Ambulatory Visit: Payer: BC Managed Care – PPO | Admitting: Internal Medicine

## 2019-10-07 ENCOUNTER — Other Ambulatory Visit (HOSPITAL_COMMUNITY)
Admission: RE | Admit: 2019-10-07 | Discharge: 2019-10-07 | Disposition: A | Discharge status: Home / Self Care | Payer: BC Managed Care – PPO | Source: Ambulatory Visit | Attending: Internal Medicine | Admitting: Internal Medicine

## 2019-10-07 DIAGNOSIS — Z20822 Contact with and (suspected) exposure to covid-19: Secondary | ICD-10-CM | POA: Insufficient documentation

## 2019-10-08 LAB — NOVEL CORONAVIRUS, NAA (HOSP ORDER, SEND-OUT TO REF LAB; TAT 18-24 HRS): SARS-CoV-2, NAA: NOT DETECTED

## 2019-10-09 NOTE — Progress Notes (Signed)
Spoke with pt and notified of results per Dr. Wert. Pt verbalized understanding and denied any questions. 

## 2019-10-11 ENCOUNTER — Ambulatory Visit: Payer: BC Managed Care – PPO | Admitting: Primary Care

## 2019-10-11 ENCOUNTER — Ambulatory Visit: Payer: BC Managed Care – PPO | Admitting: Internal Medicine

## 2019-10-11 ENCOUNTER — Ambulatory Visit (INDEPENDENT_AMBULATORY_CARE_PROVIDER_SITE_OTHER): Payer: BC Managed Care – PPO | Admitting: Internal Medicine

## 2019-10-11 ENCOUNTER — Other Ambulatory Visit: Payer: Self-pay

## 2019-10-11 DIAGNOSIS — J449 Chronic obstructive pulmonary disease, unspecified: Secondary | ICD-10-CM

## 2019-10-11 LAB — PULMONARY FUNCTION TEST
DL/VA % pred: 41 %
DL/VA: 1.83 ml/min/mmHg/L
DLCO unc % pred: 29 %
DLCO unc: 4.79 ml/min/mmHg
FEF 25-75 Post: 0.22 L/sec
FEF 25-75 Pre: 0.21 L/sec
FEF2575-%Change-Post: 3 %
FEF2575-%Pred-Post: 11 %
FEF2575-%Pred-Pre: 11 %
FEV1-%Change-Post: 5 %
FEV1-%Pred-Post: 29 %
FEV1-%Pred-Pre: 28 %
FEV1-Post: 0.58 L
FEV1-Pre: 0.55 L
FEV1FVC-%Change-Post: 3 %
FEV1FVC-%Pred-Pre: 56 %
FEV6-%Change-Post: 1 %
FEV6-%Pred-Post: 49 %
FEV6-%Pred-Pre: 49 %
FEV6-Post: 1.22 L
FEV6-Pre: 1.2 L
FEV6FVC-%Change-Post: 0 %
FEV6FVC-%Pred-Post: 99 %
FEV6FVC-%Pred-Pre: 99 %
FVC-%Change-Post: 2 %
FVC-%Pred-Post: 50 %
FVC-%Pred-Pre: 49 %
FVC-Post: 1.28 L
FVC-Pre: 1.25 L
Post FEV1/FVC ratio: 45 %
Post FEV6/FVC ratio: 96 %
Pre FEV1/FVC ratio: 44 %
Pre FEV6/FVC Ratio: 96 %

## 2019-10-11 NOTE — Progress Notes (Signed)
PFT done today. 

## 2019-10-12 ENCOUNTER — Encounter: Payer: Self-pay | Admitting: Primary Care

## 2019-10-12 ENCOUNTER — Ambulatory Visit (INDEPENDENT_AMBULATORY_CARE_PROVIDER_SITE_OTHER): Payer: BC Managed Care – PPO | Admitting: Primary Care

## 2019-10-12 DIAGNOSIS — J449 Chronic obstructive pulmonary disease, unspecified: Secondary | ICD-10-CM | POA: Diagnosis not present

## 2019-10-12 MED ORDER — BREZTRI AEROSPHERE 160-9-4.8 MCG/ACT IN AERO: 2.0000 | INHALATION_SPRAY | Freq: Two times a day (BID) | RESPIRATORY_TRACT | 0 refills | Status: DC

## 2019-10-12 MED ORDER — BREZTRI AEROSPHERE 160-9-4.8 MCG/ACT IN AERO: 2.0000 | INHALATION_SPRAY | Freq: Two times a day (BID) | RESPIRATORY_TRACT | 6 refills | Status: DC

## 2019-10-12 NOTE — Patient Instructions (Signed)
PFTs showed severe obstruction consistent with COPD GOLD D  Recommendations: - Continue BREZTRI two puffs twice daily (sending prescription card/sample/RX) - Use albuterol rescue inhaler two puffs every 6 hours for BREAKTHROUGH shortness of breath.wheezing - Very important you stay active - Recommend covid vaccine when available   Follow-up: 3-4 months with Dr. Melvyn Novas (bring in all inhalers/medications to visit)   COPD and Physical Activity Chronic obstructive pulmonary disease (COPD) is a long-term (chronic) condition that affects the lungs. COPD is a general term that can be used to describe many different lung problems that cause lung swelling (inflammation) and limit airflow, including chronic bronchitis and emphysema. The main symptom of COPD is shortness of breath, which makes it harder to do even simple tasks. This can also make it harder to exercise and be active. Talk with your health care provider about treatments to help you breathe better and actions you can take to prevent breathing problems during physical activity. What are the benefits of exercising with COPD? Exercising regularly is an important part of a healthy lifestyle. You can still exercise and do physical activities even though you have COPD. Exercise and physical activity improve your shortness of breath by increasing blood flow (circulation). This causes your heart to pump more oxygen through your body. Moderate exercise can improve your:  Oxygen use.  Energy level.  Shortness of breath.  Strength in your breathing muscles.  Heart health.  Sleep.  Self-esteem and feelings of self-worth.  Depression, stress, and anxiety levels. Exercise can benefit everyone with COPD. The severity of your disease may affect how hard you can exercise, especially at first, but everyone can benefit. Talk with your health care provider about how much exercise is safe for you, and which activities and exercises are safe for  you. What actions can I take to prevent breathing problems during physical activity?  Sign up for a pulmonary rehabilitation program. This type of program may include: ? Education about lung diseases. ? Exercise classes that teach you how to exercise and be more active while improving your breathing. This usually involves:  Exercise using your lower extremities, such as a stationary bicycle.  About 30 minutes of exercise, 2 to 5 times per week, for 6 to 12 weeks  Strength training, such as push ups or leg lifts. ? Nutrition education. ? Group classes in which you can talk with others who also have COPD and learn ways to manage stress.  If you use an oxygen tank, you should use it while you exercise. Work with your health care provider to adjust your oxygen for your physical activity. Your resting flow rate is different from your flow rate during physical activity.  While you are exercising: ? Take slow breaths. ? Pace yourself and do not try to go too fast. ? Purse your lips while breathing out. Pursing your lips is similar to a kissing or whistling position. ? If doing exercise that uses a quick burst of effort, such as weight lifting:  Breathe in before starting the exercise.  Breathe out during the hardest part of the exercise (such as raising the weights). Where to find support You can find support for exercising with COPD from:  Your health care provider.  A pulmonary rehabilitation program.  Your local health department or community health programs.  Support groups, online or in-person. Your health care provider may be able to recommend support groups. Where to find more information You can find more information about exercising with COPD from:  American Lung Association: [place].hu.  COPD Foundation: AlmostHot.gl. Contact a health care provider if:  Your symptoms get worse.  You have chest pain.  You have nausea.  You have a fever.  You have trouble  talking or catching your breath.  You want to start a new exercise program or a new activity. Summary  COPD is a general term that can be used to describe many different lung problems that cause lung swelling (inflammation) and limit airflow. This includes chronic bronchitis and emphysema.  Exercise and physical activity improve your shortness of breath by increasing blood flow (circulation). This causes your heart to provide more oxygen to your body.  Contact your health care provider before starting any exercise program or new activity. Ask your health care provider what exercises and activities are safe for you. This information is not intended to replace advice given to you by your health care provider. Make sure you discuss any questions you have with your health care provider. Document Revised: 01/04/2019 Document Reviewed: 10/07/2017 Elsevier Patient Education  2020 [employer].

## 2019-10-12 NOTE — Addendum Note (Signed)
Addended by: Glenford Bayley on: 10/12/2019 10:00 AM   Modules accepted: Orders

## 2019-10-12 NOTE — Progress Notes (Signed)
Patient had a televisit with Ames Dura, NP today and she requested that I mail the patient one sample of Breztri inhaler and a coupon card. I have placed this documented sample in a [employer]Ex envelope and filled out the label and have placed it upfront for pick up to be delivered to the patient. I have retained a copy of the label for our records.

## 2019-10-12 NOTE — Progress Notes (Signed)
Virtual Visit via Telephone Note  I connected with Jodi Curtis on 10/12/19 at  9:00 AM EST by telephone and verified that I am speaking with the correct person using two identifiers.  Location: Patient: Home Provider: Office   I discussed the limitations, risks, security and privacy concerns of performing an evaluation and management service by telephone and the availability of in person appointments. I also discussed with the patient that there may be a patient responsible charge related to this service. The patient expressed understanding and agreed to proceed.  Brief patient profile:  62 yowf SS with nl levels of Alpha one AT completely quit all smoking(cigs and e cigs)  12/2018 on trelegy maint x 2016 but worse doe x 12/2018 and only really better with prednisone so referred to pulmonary clinic 03/14/2019 by Mauricio Po with presumed copd   History of Present Illness: 64 year old female, former smoker quit in April 2020. PMH significant for presumed COPD group D. Patient of Dr. Sherene Sires, last seen on 05/09/19. Maintained on Symbicort 160 and Spiriva. Needs formal PFTs. Consider changing to Markus Daft (she has been on Trelegy in the past).   10/12/2019 Patient contacted today for televisit to review recent PFTs. Pulmonary function testing 10/10/18 showed severe COPD. She has been doing quite well since her last visit in [place]. She received a sample of BREZTRI and really likes this. Using her SABA less. She has not had any recent exacerbations requiring antibiotics or prednisone. Continues to work. She is interested in getting COVID vaccine when available.   Observations/Objective:  - No observed shortness of breath, wheezing or cough   PFTs 10/12/2019 FVC 1.28 (50%), FEV1 0.58 (29%), ratio 45, DLCOunc 4.79 (29%). No BD response Interpretation- Severe obstructive airway disease, possible restriction, severe diffusion defect   Assessment and Plan:  COPD GOLD D: - Symptoms improved on triple  therapy (ICS/LABA/LAMA) TWICE daily dosing  - PFTs 10/10/18 showed severe obstructive airway disease and diffusion defect - Continue BREZTRI two puffs BID (sending prescription card/sample and RX) - Use Albuterol hfa 2 puffs every 6 hours for BREAKTHROUGH sob/wheezing   Follow Up Instructions:  - 3-4 months with Dr. Sherene Sires    I discussed the assessment and treatment plan with the patient. The patient was provided an opportunity to ask questions and all were answered. The patient agreed with the plan and demonstrated an understanding of the instructions.   The patient was advised to call back or seek an in-person evaluation if the symptoms worsen or if the condition fails to improve as anticipated.  I provided 18 minutes of non-face-to-face time during this encounter.   Glenford Bayley, NP

## 2019-11-14 ENCOUNTER — Telehealth: Payer: Self-pay | Admitting: Primary Care

## 2019-11-14 MED ORDER — BREZTRI AEROSPHERE 160-9-4.8 MCG/ACT IN AERO: 2.0000 | INHALATION_SPRAY | Freq: Two times a day (BID) | RESPIRATORY_TRACT | 6 refills | Status: AC

## 2019-11-14 NOTE — Telephone Encounter (Signed)
Rx has been resent in for a full 30 day supply. Nothing further was needed.

## 2020-01-10 ENCOUNTER — Other Ambulatory Visit: Payer: Self-pay

## 2020-01-10 ENCOUNTER — Ambulatory Visit: Payer: BC Managed Care – PPO | Admitting: Primary Care

## 2020-01-10 ENCOUNTER — Ambulatory Visit: Payer: BC Managed Care – PPO | Admitting: Internal Medicine

## 2020-01-10 ENCOUNTER — Telehealth: Payer: Self-pay | Admitting: Primary Care

## 2020-01-10 NOTE — Telephone Encounter (Signed)
Called patient for televisit, LM

## 2020-03-11 ENCOUNTER — Encounter: Payer: Self-pay | Admitting: Internal Medicine

## 2020-03-11 DIAGNOSIS — Z87891 Personal history of nicotine dependence: Secondary | ICD-10-CM | POA: Insufficient documentation

## 2020-03-11 HISTORY — DX: Personal history of nicotine dependence: Z87.891

## 2020-03-19 DIAGNOSIS — J3489 Other specified disorders of nose and nasal sinuses: Secondary | ICD-10-CM | POA: Insufficient documentation

## 2020-03-19 DIAGNOSIS — Z119 Encounter for screening for infectious and parasitic diseases, unspecified: Secondary | ICD-10-CM | POA: Insufficient documentation

## 2020-06-13 ENCOUNTER — Encounter: Payer: Self-pay | Admitting: Oncology

## 2020-06-13 ENCOUNTER — Other Ambulatory Visit: Payer: Self-pay | Admitting: Oncology

## 2020-06-13 ENCOUNTER — Ambulatory Visit (HOSPITAL_COMMUNITY)
Admission: RE | Admit: 2020-06-13 | Discharge: 2020-06-13 | Disposition: A | Discharge status: Home / Self Care | Payer: BC Managed Care – PPO | Source: Ambulatory Visit | Attending: Pulmonary Disease | Admitting: Pulmonary Disease

## 2020-06-13 DIAGNOSIS — U071 COVID-19: Secondary | ICD-10-CM | POA: Insufficient documentation

## 2020-06-13 MED ORDER — ALBUTEROL SULFATE HFA 108 (90 BASE) MCG/ACT IN AERS: 2.0000 | INHALATION_SPRAY | Freq: Once | RESPIRATORY_TRACT | Status: DC | PRN

## 2020-06-13 MED ORDER — EPINEPHRINE 0.3 MG/0.3ML IJ SOAJ: 0.3000 mg | Freq: Once | INTRAMUSCULAR | Status: DC | PRN

## 2020-06-13 MED ORDER — DIPHENHYDRAMINE HCL 50 MG/ML IJ SOLN: 50.0000 mg | Freq: Once | INTRAMUSCULAR | Status: DC | PRN

## 2020-06-13 MED ORDER — SODIUM CHLORIDE 0.9 % IV SOLN: INTRAVENOUS | Status: DC | PRN

## 2020-06-13 MED ORDER — FAMOTIDINE IN NACL 20-0.9 MG/50ML-% IV SOLN: 20.0000 mg | Freq: Once | INTRAVENOUS | Status: DC | PRN

## 2020-06-13 MED ORDER — SODIUM CHLORIDE 0.9 % IV SOLN
1200.0000 mg | Freq: Once | INTRAVENOUS | Status: AC
  Administered 2020-06-13: 1200 mg via INTRAVENOUS

## 2020-06-13 MED ORDER — METHYLPREDNISOLONE SODIUM SUCC 125 MG IJ SOLR: 125.0000 mg | Freq: Once | INTRAMUSCULAR | Status: DC | PRN

## 2020-06-13 NOTE — Discharge Instructions (Signed)

## 2020-06-13 NOTE — Progress Notes (Signed)
I connected by phone with  Jodi Curtis to discuss the potential use of an new treatment for mild to moderate COVID-19 viral infection in non-hospitalized patients.   This patient is a age/sex that meets the FDA criteria for Emergency Use Authorization of casirivimab\imdevimab.  Has a (+) direct SARS-CoV-2 viral test result 1. Has mild or moderate COVID-19  2. Is ? 63 years of age and weighs ? 40 kg 3. Is NOT hospitalized due to COVID-19 4. Is NOT requiring oxygen therapy or requiring an increase in baseline oxygen flow rate due to COVID-19 5. Is within 10 days of symptom onset 6. Has at least one of the high risk factor(s) for progression to severe COVID-19 and/or hospitalization as defined in EUA. Specific high risk criteria : Past Medical History:  Diagnosis Date  . COPD (chronic obstructive pulmonary disease) (HCC)   . Hypertension   ?  ?    Symptom onset  06/08/2020   I have spoken and communicated the following to the patient or parent/caregiver:   1. FDA has authorized the emergency use of casirivimab\imdevimab for the treatment of mild to moderate COVID-19 in adults and pediatric patients with positive results of direct SARS-CoV-2 viral testing who are 61 years of age and older weighing at least 40 kg, and who are at high risk for progressing to severe COVID-19 and/or hospitalization.   2. The significant known and potential risks and benefits of casirivimab\imdevimab, and the extent to which such potential risks and benefits are unknown.   3. Information on available alternative treatments and the risks and benefits of those alternatives, including clinical trials.   4. Patients treated with casirivimab\imdevimab should continue to self-isolate and use infection control measures (e.g., wear mask, isolate, social distance, avoid sharing personal items, clean and disinfect "high touch" surfaces, and frequent handwashing) according to CDC guidelines.    5. The patient or  parent/caregiver has the option to accept or refuse casirivimab\imdevimab .   After reviewing this information with the patient, The patient agreed to proceed with receiving casirivimab\imdevimab infusion and will be provided a copy of the Fact sheet prior to receiving the infusion.Mignon Pine, AGNP-C [phone] (Infusion Center Hotline)

## 2020-06-13 NOTE — Progress Notes (Signed)
  Diagnosis: COVID-19  Physician:Dr. Wright Procedure: [facility] Med: casirivimab\imdevimab infusion - Provided patient with casirivimab\imdevimab fact sheet for patients, parents and caregivers prior to infusion.  Complications: No immediate complications noted.  Discharge: Discharged home   Braison Snoke S Divina Neale 06/13/2020  

## 2020-06-14 ENCOUNTER — Other Ambulatory Visit (HOSPITAL_COMMUNITY): Payer: Self-pay

## 2021-01-20 DIAGNOSIS — R079 Chest pain, unspecified: Secondary | ICD-10-CM | POA: Insufficient documentation

## 2021-01-20 DIAGNOSIS — Z87891 Personal history of nicotine dependence: Secondary | ICD-10-CM | POA: Insufficient documentation

## 2021-01-20 DIAGNOSIS — R0600 Dyspnea, unspecified: Secondary | ICD-10-CM | POA: Insufficient documentation

## 2021-01-20 DIAGNOSIS — R0789 Other chest pain: Secondary | ICD-10-CM | POA: Insufficient documentation

## 2021-02-03 DIAGNOSIS — R0789 Other chest pain: Secondary | ICD-10-CM | POA: Insufficient documentation

## 2021-02-03 DIAGNOSIS — R9431 Abnormal electrocardiogram [ECG] [EKG]: Secondary | ICD-10-CM | POA: Insufficient documentation

## 2021-04-14 DIAGNOSIS — R7303 Prediabetes: Secondary | ICD-10-CM | POA: Insufficient documentation

## 2021-04-14 DIAGNOSIS — R5383 Other fatigue: Secondary | ICD-10-CM | POA: Insufficient documentation

## 2021-05-22 DIAGNOSIS — J432 Centrilobular emphysema: Secondary | ICD-10-CM | POA: Insufficient documentation

## 2021-05-22 DIAGNOSIS — J9611 Chronic respiratory failure with hypoxia: Secondary | ICD-10-CM | POA: Insufficient documentation

## 2021-10-01 DIAGNOSIS — U071 COVID-19: Secondary | ICD-10-CM | POA: Insufficient documentation

## 2022-04-08 DIAGNOSIS — M79641 Pain in right hand: Secondary | ICD-10-CM | POA: Insufficient documentation

## 2022-04-08 DIAGNOSIS — M79602 Pain in left arm: Secondary | ICD-10-CM | POA: Insufficient documentation

## 2022-04-08 DIAGNOSIS — R202 Paresthesia of skin: Secondary | ICD-10-CM | POA: Insufficient documentation

## 2022-04-08 DIAGNOSIS — M542 Cervicalgia: Secondary | ICD-10-CM | POA: Insufficient documentation

## 2022-04-28 DIAGNOSIS — R799 Abnormal finding of blood chemistry, unspecified: Secondary | ICD-10-CM | POA: Insufficient documentation

## 2022-08-24 DIAGNOSIS — Z6820 Body mass index (BMI) 20.0-20.9, adult: Secondary | ICD-10-CM | POA: Insufficient documentation

## 2022-11-23 ENCOUNTER — Inpatient Hospital Stay (HOSPITAL_COMMUNITY)
Admission: EM | Admit: 2022-11-23 | Discharge: 2022-11-25 | DRG: 194 | Disposition: A | Discharge status: Home Health | Payer: Medicare Other | Attending: Family Medicine | Admitting: Family Medicine

## 2022-11-23 ENCOUNTER — Emergency Department (HOSPITAL_COMMUNITY): Payer: Medicare Other

## 2022-11-23 ENCOUNTER — Encounter (HOSPITAL_COMMUNITY): Payer: Self-pay

## 2022-11-23 ENCOUNTER — Other Ambulatory Visit: Payer: Self-pay

## 2022-11-23 DIAGNOSIS — Z9049 Acquired absence of other specified parts of digestive tract: Secondary | ICD-10-CM

## 2022-11-23 DIAGNOSIS — J9600 Acute respiratory failure, unspecified whether with hypoxia or hypercapnia: Secondary | ICD-10-CM

## 2022-11-23 DIAGNOSIS — J441 Chronic obstructive pulmonary disease with (acute) exacerbation: Secondary | ICD-10-CM | POA: Diagnosis not present

## 2022-11-23 DIAGNOSIS — J9611 Chronic respiratory failure with hypoxia: Secondary | ICD-10-CM | POA: Diagnosis present

## 2022-11-23 DIAGNOSIS — Z79899 Other long term (current) drug therapy: Secondary | ICD-10-CM

## 2022-11-23 DIAGNOSIS — Z7982 Long term (current) use of aspirin: Secondary | ICD-10-CM

## 2022-11-23 DIAGNOSIS — Z87891 Personal history of nicotine dependence: Secondary | ICD-10-CM

## 2022-11-23 DIAGNOSIS — J449 Chronic obstructive pulmonary disease, unspecified: Principal | ICD-10-CM

## 2022-11-23 DIAGNOSIS — Z880 Allergy status to penicillin: Secondary | ICD-10-CM | POA: Diagnosis not present

## 2022-11-23 DIAGNOSIS — I10 Essential (primary) hypertension: Secondary | ICD-10-CM | POA: Diagnosis not present

## 2022-11-23 DIAGNOSIS — J440 Chronic obstructive pulmonary disease with acute lower respiratory infection: Secondary | ICD-10-CM | POA: Diagnosis present

## 2022-11-23 DIAGNOSIS — Z9851 Tubal ligation status: Secondary | ICD-10-CM | POA: Diagnosis not present

## 2022-11-23 DIAGNOSIS — Z885 Allergy status to narcotic agent status: Secondary | ICD-10-CM

## 2022-11-23 DIAGNOSIS — J9620 Acute and chronic respiratory failure, unspecified whether with hypoxia or hypercapnia: Secondary | ICD-10-CM

## 2022-11-23 DIAGNOSIS — Z7951 Long term (current) use of inhaled steroids: Secondary | ICD-10-CM

## 2022-11-23 DIAGNOSIS — Z1152 Encounter for screening for COVID-19: Secondary | ICD-10-CM

## 2022-11-23 DIAGNOSIS — Z66 Do not resuscitate: Secondary | ICD-10-CM | POA: Diagnosis present

## 2022-11-23 DIAGNOSIS — Z882 Allergy status to sulfonamides status: Secondary | ICD-10-CM | POA: Diagnosis not present

## 2022-11-23 DIAGNOSIS — E876 Hypokalemia: Secondary | ICD-10-CM | POA: Diagnosis not present

## 2022-11-23 DIAGNOSIS — J189 Pneumonia, unspecified organism: Secondary | ICD-10-CM | POA: Diagnosis not present

## 2022-11-23 DIAGNOSIS — J9601 Acute respiratory failure with hypoxia: Secondary | ICD-10-CM | POA: Diagnosis not present

## 2022-11-23 DIAGNOSIS — F419 Anxiety disorder, unspecified: Secondary | ICD-10-CM | POA: Insufficient documentation

## 2022-11-23 DIAGNOSIS — B179 Acute viral hepatitis, unspecified: Secondary | ICD-10-CM | POA: Diagnosis not present

## 2022-11-23 HISTORY — DX: Acute respiratory failure, unspecified whether with hypoxia or hypercapnia: J96.00

## 2022-11-23 LAB — COMPREHENSIVE METABOLIC PANEL
ALT: 104 U/L — ABNORMAL HIGH (ref 0–44)
AST: 140 U/L — ABNORMAL HIGH (ref 15–41)
Albumin: 2.9 g/dL — ABNORMAL LOW (ref 3.5–5.0)
Alkaline Phosphatase: 264 U/L — ABNORMAL HIGH (ref 38–126)
Anion gap: 16 — ABNORMAL HIGH (ref 5–15)
BUN: 16 mg/dL (ref 8–23)
CO2: 29 mmol/L (ref 22–32)
Calcium: 9.4 mg/dL (ref 8.9–10.3)
Chloride: 94 mmol/L — ABNORMAL LOW (ref 98–111)
Creatinine, Ser: 0.77 mg/dL (ref 0.44–1.00)
GFR, Estimated: >60 mL/min (ref >60)
Glucose, Bld: 126 mg/dL — ABNORMAL HIGH (ref 70–99)
Potassium: 3.5 mmol/L (ref 3.5–5.1)
Sodium: 139 mmol/L (ref 135–145)
Total Bilirubin: 1 mg/dL (ref 0.3–1.2)
Total Protein: 6.6 g/dL (ref 6.5–8.1)

## 2022-11-23 LAB — CBC WITH DIFFERENTIAL/PLATELET
Abs Immature Granulocytes: 0.15 10*3/uL — ABNORMAL HIGH (ref 0.00–0.07)
Basophils Absolute: 0.1 10*3/uL (ref 0.0–0.1)
Basophils Relative: 0 %
Eosinophils Absolute: 0.2 10*3/uL (ref 0.0–0.5)
Eosinophils Relative: 1 %
HCT: 37.6 % (ref 36.0–46.0)
Hemoglobin: 11.9 g/dL — ABNORMAL LOW (ref 12.0–15.0)
Immature Granulocytes: 1 %
Lymphocytes Relative: 3 %
Lymphs Abs: 0.5 10*3/uL — ABNORMAL LOW (ref 0.7–4.0)
MCH: 29.5 pg (ref 26.0–34.0)
MCHC: 31.6 g/dL (ref 30.0–36.0)
MCV: 93.1 fL (ref 80.0–100.0)
Monocytes Absolute: 1.2 10*3/uL — ABNORMAL HIGH (ref 0.1–1.0)
Monocytes Relative: 7 %
Neutro Abs: 15.3 10*3/uL — ABNORMAL HIGH (ref 1.7–7.7)
Neutrophils Relative %: 88 %
Platelets: 240 10*3/uL (ref 150–400)
RBC: 4.04 MIL/uL (ref 3.87–5.11)
RDW: 12.2 % (ref 11.5–15.5)
WBC: 17.4 10*3/uL — ABNORMAL HIGH (ref 4.0–10.5)
nRBC: 0 % (ref 0.0–0.2)

## 2022-11-23 LAB — RESP PANEL BY RT-PCR (RSV, FLU A&B, COVID)  RVPGX2
Influenza A by PCR: NEGATIVE
Influenza B by PCR: NEGATIVE
Resp Syncytial Virus by PCR: NEGATIVE
SARS Coronavirus 2 by RT PCR: NEGATIVE

## 2022-11-23 LAB — I-STAT VENOUS BLOOD GAS, ED
Acid-Base Excess: 10 mmol/L — ABNORMAL HIGH (ref 0.0–2.0)
Bicarbonate: 36.1 mmol/L — ABNORMAL HIGH (ref 20.0–28.0)
Calcium, Ion: 1.1 mmol/L — ABNORMAL LOW (ref 1.15–1.40)
HCT: 36 % (ref 36.0–46.0)
Hemoglobin: 12.2 g/dL (ref 12.0–15.0)
O2 Saturation: 98 %
Potassium: 3.3 mmol/L — ABNORMAL LOW (ref 3.5–5.1)
Sodium: 136 mmol/L (ref 135–145)
TCO2: 38 mmol/L — ABNORMAL HIGH (ref 22–32)
pCO2, Ven: 55.9 mmHg (ref 44–60)
pH, Ven: 7.419 (ref 7.25–7.43)
pO2, Ven: 110 mmHg — ABNORMAL HIGH (ref 32–45)

## 2022-11-23 LAB — LACTIC ACID, PLASMA: Lactic Acid, Venous: 1.3 mmol/L (ref 0.5–1.9)

## 2022-11-23 LAB — BRAIN NATRIURETIC PEPTIDE: B Natriuretic Peptide: 77.2 pg/mL (ref 0.0–100.0)

## 2022-11-23 LAB — TROPONIN I (HIGH SENSITIVITY)
Troponin I (High Sensitivity): 15 ng/L (ref <18)
Troponin I (High Sensitivity): 15 ng/L (ref <18)

## 2022-11-23 MED ORDER — ALBUTEROL SULFATE (2.5 MG/3ML) 0.083% IN NEBU: 2.5000 mg | INHALATION_SOLUTION | RESPIRATORY_TRACT | Status: DC

## 2022-11-23 MED ORDER — SODIUM CHLORIDE 0.9 % IV SOLN
500.0000 mg | Freq: Once | INTRAVENOUS | Status: AC
  Administered 2022-11-23: 500 mg via INTRAVENOUS
  Filled 2022-11-23: qty 5

## 2022-11-23 MED ORDER — ALBUTEROL SULFATE (2.5 MG/3ML) 0.083% IN NEBU
10.0000 mg | INHALATION_SOLUTION | Freq: Once | RESPIRATORY_TRACT | Status: AC
  Administered 2022-11-23: 10 mg via RESPIRATORY_TRACT
  Filled 2022-11-23: qty 12

## 2022-11-23 MED ORDER — ALBUTEROL SULFATE (2.5 MG/3ML) 0.083% IN NEBU: 2.5000 mg | INHALATION_SOLUTION | RESPIRATORY_TRACT | Status: DC | PRN

## 2022-11-23 MED ORDER — SODIUM CHLORIDE 0.9 % IV SOLN
1.0000 g | Freq: Once | INTRAVENOUS | Status: AC
  Administered 2022-11-23: 1 g via INTRAVENOUS
  Filled 2022-11-23: qty 10

## 2022-11-23 MED ORDER — IOHEXOL 350 MG/ML SOLN
75.0000 mL | Freq: Once | INTRAVENOUS | Status: AC | PRN
  Administered 2022-11-23: 75 mL via INTRAVENOUS

## 2022-11-23 MED ORDER — ALBUTEROL SULFATE (2.5 MG/3ML) 0.083% IN NEBU
2.5000 mg | INHALATION_SOLUTION | Freq: Three times a day (TID) | RESPIRATORY_TRACT | Status: DC
  Filled 2022-11-23: qty 3

## 2022-11-23 MED ORDER — IPRATROPIUM BROMIDE 0.02 % IN SOLN
1.0000 mg | Freq: Once | RESPIRATORY_TRACT | Status: AC
  Administered 2022-11-23: 1 mg via RESPIRATORY_TRACT
  Filled 2022-11-23: qty 5

## 2022-11-23 MED ORDER — MAGNESIUM SULFATE 2 GM/50ML IV SOLN
2.0000 g | Freq: Once | INTRAVENOUS | Status: AC
  Administered 2022-11-23: 2 g via INTRAVENOUS
  Filled 2022-11-23: qty 50

## 2022-11-23 NOTE — ED Provider Triage Note (Signed)
Emergency Medicine Provider Triage Evaluation Note  Jodi Curtis , a 67 y.o. female  was evaluated in triage.  Patient has a history of COPD and has worsening shortness of breath.  Patient tested negative for COVID and flu at the urgent care.  Patient was sent here for increased work of breathing.  Patient received 1 neb by EMS and 1 at urgent care and 125 mg of Solu-Medrol by EMS.  Patient Is on 3 to 4 L nasal cannula at baseline   Review of Systems  Positive: Shortness of breath, chest pain  Negative: fever  Physical Exam  BP 115/68   Pulse (!) 109   Temp 98.3 F (36.8 C)   Resp 20   Ht '4\' 11"'$  (1.499 m)   Wt 44.5 kg   SpO2 93%   BMI 19.79 kg/m  Gen:   Tachypneic, doing well on O2  Resp:  Tachypneic, mild diffuse wheezing, no retractions MSK:   Moves extremities without difficulty  Other:    Medical Decision Making  Medically screening exam initiated at 4:08 PM.  Appropriate orders placed.  Elverda Pry was informed that the remainder of the evaluation will be completed by another provider, this initial triage assessment does not replace that evaluation, and the importance of remaining in the ED until their evaluation is complete.  Patient has a history of COPD here presenting with shortness of breath and chest pain.  Patient had a positive contact with flu.  Patient is mildly tachypneic and has mild diffuse wheezing but no retractions.  Plan to get CBC and CMP and chest x-ray and troponin and VBG.  Patient will likely need more albuterol with she gets into her room.   Drenda Freeze, MD 11/23/22 [phone]

## 2022-11-23 NOTE — H&P (Incomplete)
History and Physical    Patient: Jodi Curtis [id number] DOB: January 11, 1956 DOA: 11/23/2022 DOS: the patient was seen and examined on 11/23/2022 PCP: Rhea Bleacher, NP  Patient coming from: Home  Chief Complaint:  Chief Complaint  Patient presents with  . Shortness of Breath    COPD    HPI: Jodi Curtis is a 67 y.o. female with medical history significant of COPD, anxiety and hypertension that presents for increasing shortness of breath.  Patient has had a cough since last Tuesday.  The family members had influenza and He was taking Tamiflu but it made her feel worse so she stopped taking it.  Denies having a fever, nausea, vomiting, diarrhea, headache or abdominal pain.  Endorses chest tightness.  She has been using her breathing treatments more often and had to turn her home oxygen from 3L to 5L.  Husband passed away for COPD complications.   ED course: Initial vitals revealed tachycardia (109) and intermittent tachypnea satting in the low 90s on 3 L.  Lab workup reveals leukocytosis with left shift in cell lines, WBC 17.4, AST 140, ALT 104, ALP 264, troponin 15 (x 2), BNP 77.2, pH7.4 bicarb 36.1, CO2 38, blood cultures obtained, lactic acid 1.3 and COVID, influenza and RSV negative.  Chest x-ray concerning for multifocal pneumonia, CTA chest without evidence of PE, patchy and tree-in-bud opacities in the left lower lobe and lingula with reactive mediastinal and hilar lymphadenopathy.  CT abdomen pelvis showed no acute process.     Review of Systems: As mentioned in the history of present illness. All other systems reviewed and are negative. Past Medical History:  Diagnosis Date  . COPD (chronic obstructive pulmonary disease) ([place])   . Hypertension    Past Surgical History:  Procedure Laterality Date  . CHOLECYSTECTOMY    . TUBAL LIGATION     Social History:  reports that she quit smoking about 5 years ago. Her smoking use included cigarettes. She has a 30.00 pack-year smoking  history. She has never used smokeless tobacco. She reports that she does not drink alcohol and does not use drugs.  Allergies  Allergen Reactions  . Codeine   . Penicillins Other (See Comments)    Internal bleeding  . Sulfa Antibiotics     History reviewed. No pertinent family history.  Prior to Admission medications   Medication Sig Start Date End Date Taking? Authorizing Provider  amLODipine (NORVASC) 10 MG tablet Take 10 mg by mouth daily.    [provider]  aspirin EC 81 MG tablet Take 81 mg by mouth daily.    [provider]  atenolol (TENORMIN) 25 MG tablet Take 25 mg by mouth daily.     [provider]  BREZTRI AEROSPHERE 160-9-4.8 MCG/ACT AERO Inhale 2 puffs into the lungs 2 (two) times daily. 11/14/19   Martyn Ehrich, NP  Budeson-Glycopyrrol-Formoterol (BREZTRI AEROSPHERE) 160-9-4.8 MCG/ACT AERO Inhale 2 puffs into the lungs 2 (two) times daily. 10/12/19   Martyn Ehrich, NP  buPROPion (WELLBUTRIN) 100 MG tablet Take 100 mg by mouth 2 (two) times daily.    [provider]  citalopram (CELEXA) 10 MG tablet Take 10 mg by mouth daily.    [provider]  VENTOLIN HFA 108 (90 Base) MCG/ACT inhaler Inhale 2 puffs into the lungs every 4 (four) hours as needed. 02/09/19   [provider]    Physical Exam: Vitals:   11/23/22 1558 11/23/22 1600 11/23/22 1756 11/23/22 1945  BP: 115/68  126/72 116/69  Pulse: (!) 109  (!) 103 (!) 104  Resp: 20  (!) 25 (!) 21  Temp: 98.3 F (36.8 C)     SpO2: 93%  93% 96%  Weight:  44.5 kg    Height:  '4\' 11"'$  (1.499 m)     GEN:     alert, female in no acute distress, speaking in full sentences without pause   HENT:  mucus membranes moist, oropharyngeal without lesions or erythema,  nares patent, no nasal discharge  EYES:   pupils equal and reactive, EOM intact NECK:  supple, normal ROM, no lymphadenopathy *** RESP:  clear to auscultation bilaterally, no increased work of breathing  *** CVS:   regular rate and rhythm, no murmur, distal pulses intact  *** ABD:  soft, non-tender; bowel sounds present; no palpable masses,  *** GU:  normal female/female *** EXT:   normal ROM, atraumatic, no edema *** NEURO:  normal without focal findings,  speech normal, alert and oriented  *** Skin:   warm and dry, no rash***, normal*** skin turgor Psych: Normal affect, appropriate speech and behavior   Data Reviewed: {Tip this will not be part of the note when signed- Document your independent interpretation of telemetry tracing, EKG, lab, Radiology test or any other diagnostic tests. Add any new diagnostic test ordered today. (Optional):26781} {Results:26384}  CBC: WBC 17.4, hemoglobin 11.9, platelets 240    Assessment and Plan: No notes have been filed under this hospital service. Service: Hospitalist  CKD ***  Hypertension ***  Acute hepatitis She has elevation in her liver enzymes.  Total bilirubin 1.0. ***   Advance Care Planning:   Code Status: Not on file ***  Consults: ***  Family Communication: ***  Severity of Illness: {Observation/Inpatient:21159}  Author: Lyndee Hensen, DO 11/23/2022 9:22 PM  For on call review [email or web address].

## 2022-11-23 NOTE — ED Notes (Signed)
Patient transported to Xray, appears short of breath but not in acute distress, able to communicate in full sentences at this time.

## 2022-11-23 NOTE — ED Notes (Signed)
ED TO INPATIENT HANDOFF REPORT  ED Nurse Name and Phone #: Cheris Tweten, RN [phone]  S Name/Age/Gender Forde Dandy 67 y.o. female Room/Bed: 005C/005C  Code Status   Code Status: Not on file  Home/SNF/Other Home Patient oriented to: self, place, time, and situation Is this baseline? Yes   Triage Complete: Triage complete  Chief Complaint Acute respiratory failure ([place]) [J96.00]  Triage Note Pt to ED BIB [facility]. From urgent care, Apparently pt "felt bad" for one week. C/O SHOB,worse with exertion, reports hx of the same.  HX COPD. Received 2 breathing treatments, 1 at UC, 1 with EMS. #18LAC,  125 MG SOLUMEDROL given by EMS.   Last VS: 130/88 hr 116 , 97%3L.    Allergies Allergies  Allergen Reactions   Codeine    Penicillins Other (See Comments)    Internal bleeding   Sulfa Antibiotics     Level of Care/Admitting Diagnosis ED Disposition     ED Disposition  Admit   Condition  --   [place]: [place] [id number]  Level of Care: Med-Surg [16]  May admit patient to [facility] or [facility] if equivalent level of care is available:: Yes  Covid Evaluation: Confirmed COVID Negative  Diagnosis: Acute respiratory failure ([place]) [518.81.ICD-9-CM]  Admitting Physician: Lyndee Hensen [id number]  Attending Physician: Lyndee Hensen [id number]  Certification:: I certify this patient will need inpatient services for at least 2 midnights  Estimated Length of Stay: 2          B Medical/Surgery History Past Medical History:  Diagnosis Date   COPD (chronic obstructive pulmonary disease) ([place])    Hypertension    Past Surgical History:  Procedure Laterality Date   CHOLECYSTECTOMY     TUBAL LIGATION       A IV Location/Drains/Wounds Patient Lines/Drains/Airways Status     Active Line/Drains/Airways     Name Placement date Placement time Site Days   Peripheral IV 11/23/22 18 G Left Antecubital 11/23/22  1718  Antecubital  less than 1             Intake/Output Last 24 hours No intake or output data in the 24 hours ending 11/23/22 2228  Labs/Imaging Results for orders placed or performed during the hospital encounter of 11/23/22 (from the past 48 hour(s))  CBC with Differential     Status: Abnormal   Collection Time: 11/23/22  5:13 PM  Result Value Ref Range   WBC 17.4 (H) 4.0 - 10.5 K/uL   RBC 4.04 3.87 - 5.11 MIL/uL   Hemoglobin 11.9 (L) 12.0 - 15.0 g/dL   HCT 37.6 36.0 - 46.0 %   MCV 93.1 80.0 - 100.0 fL   MCH 29.5 26.0 - 34.0 pg   MCHC 31.6 30.0 - 36.0 g/dL   RDW 12.2 11.5 - 15.5 %   Platelets 240 150 - 400 K/uL   nRBC 0.0 0.0 - 0.2 %   Neutrophils Relative % 88 %   Neutro Abs 15.3 (H) 1.7 - 7.7 K/uL   Lymphocytes Relative 3 %   Lymphs Abs 0.5 (L) 0.7 - 4.0 K/uL   Monocytes Relative 7 %   Monocytes Absolute 1.2 (H) 0.1 - 1.0 K/uL   Eosinophils Relative 1 %   Eosinophils Absolute 0.2 0.0 - 0.5 K/uL   Basophils Relative 0 %   Basophils Absolute 0.1 0.0 - 0.1 K/uL   Immature Granulocytes 1 %   Abs Immature Granulocytes 0.15 (H) 0.00 - 0.07 K/uL  Comment: Performed at [facility], [place] [address] [place], [place] [address]  Comprehensive metabolic panel     Status: Abnormal   Collection Time: 11/23/22  5:13 PM  Result Value Ref Range   Sodium 139 135 - 145 mmol/L   Potassium 3.5 3.5 - 5.1 mmol/L   Chloride 94 (L) 98 - 111 mmol/L   CO2 29 22 - 32 mmol/L   Glucose, Bld 126 (H) 70 - 99 mg/dL    Comment: Glucose reference range applies only to samples taken after fasting for at least 8 hours.   BUN 16 8 - 23 mg/dL   Creatinine, Ser 0.77 0.44 - 1.00 mg/dL   Calcium 9.4 8.9 - 10.3 mg/dL   Total Protein 6.6 6.5 - 8.1 g/dL   Albumin 2.9 (L) 3.5 - 5.0 g/dL   AST 140 (H) 15 - 41 U/L   ALT 104 (H) 0 - 44 U/L   Alkaline Phosphatase 264 (H) 38 - 126 U/L   Total Bilirubin 1.0 0.3 - 1.2 mg/dL   GFR, Estimated >60 >60 mL/min    Comment: (NOTE) Calculated using the CKD-EPI Creatinine Equation  (2021)    Anion gap 16 (H) 5 - 15    Comment: Performed at [facility], [place] [address] [place], [place] [address]  Troponin I (High Sensitivity)     Status: None   Collection Time: 11/23/22  5:13 PM  Result Value Ref Range   Troponin I (High Sensitivity) 15 <18 ng/L    Comment: (NOTE) Elevated high sensitivity troponin I (hsTnI) values and significant  changes across serial measurements may suggest ACS but many other  chronic and acute conditions are known to elevate hsTnI results.  Refer to the "Links" section for chest pain algorithms and additional  guidance. Performed at [facility], [place] [address] [place], [place] [address]   Brain natriuretic peptide     Status: None   Collection Time: 11/23/22  5:18 PM  Result Value Ref Range   B Natriuretic Peptide 77.2 0.0 - 100.0 pg/mL    Comment: Performed at [facility] [address], [place], [place] [address]  I-Stat venous blood gas, ED     Status: Abnormal   Collection Time: 11/23/22  6:10 PM  Result Value Ref Range   pH, Ven 7.419 7.25 - 7.43   pCO2, Ven 55.9 44 - 60 mmHg   pO2, Ven 110 (H) 32 - 45 mmHg   Bicarbonate 36.1 (H) 20.0 - 28.0 mmol/L   TCO2 38 (H) 22 - 32 mmol/L   O2 Saturation 98 %   Acid-Base Excess 10.0 (H) 0.0 - 2.0 mmol/L   Sodium 136 135 - 145 mmol/L   Potassium 3.3 (L) 3.5 - 5.1 mmol/L   Calcium, Ion 1.10 (L) 1.15 - 1.40 mmol/L   HCT 36.0 36.0 - 46.0 %   Hemoglobin 12.2 12.0 - 15.0 g/dL   Sample type VENOUS   Troponin I (High Sensitivity)     Status: None   Collection Time: 11/23/22  7:35 PM  Result Value Ref Range   Troponin I (High Sensitivity) 15 <18 ng/L    Comment: (NOTE) Elevated high sensitivity troponin I (hsTnI) values and significant  changes across serial measurements may suggest ACS but many other  chronic and acute conditions are known to elevate hsTnI results.  Refer to the "Links" section for chest pain algorithms and additional  guidance. Performed at [facility], [place]  [address], [place], [place] [address]   Lactic acid, plasma     Status: None   Collection Time: 11/23/22  7:35 PM  Result Value Ref Range   Lactic Acid, Venous 1.3 0.5 - 1.9 mmol/L    Comment: Performed at [facility] [address] [place], [place] [address]  Resp panel by RT-PCR (RSV, Flu A&B, Covid) Anterior Nasal Swab     Status: None   Collection Time: 11/23/22  8:49 PM   Specimen: Anterior Nasal Swab  Result Value Ref Range   SARS Coronavirus 2 by RT PCR NEGATIVE NEGATIVE   Influenza A by PCR NEGATIVE NEGATIVE   Influenza B by PCR NEGATIVE NEGATIVE    Comment: (NOTE) The Xpert Xpress SARS-CoV-2/FLU/RSV plus assay is intended as an aid in the diagnosis of influenza from Nasopharyngeal swab specimens and should not be used as a sole basis for treatment. Nasal washings and aspirates are unacceptable for Xpert Xpress SARS-CoV-2/FLU/RSV testing.  Fact Sheet for Patients: EntrepreneurPulse.com.au  Fact Sheet for Healthcare Providers: IncredibleEmployment.be  This test is not yet approved or cleared by the [place] FDA and has been authorized for detection and/or diagnosis of SARS-CoV-2 by FDA under an Emergency Use Authorization (EUA). This EUA will remain in effect (meaning this test can be used) for the duration of the COVID-19 declaration under Section 564(b)(1) of the Act, 21 U.S.C. section 360bbb-3(b)(1), unless the authorization is terminated or revoked.     Resp Syncytial Virus by PCR NEGATIVE NEGATIVE    Comment: (NOTE) Fact Sheet for Patients: EntrepreneurPulse.com.au  Fact Sheet for Healthcare Providers: IncredibleEmployment.be  This test is not yet approved or cleared by the [place] FDA and has been authorized for detection and/or diagnosis of SARS-CoV-2 by FDA under an Emergency Use Authorization (EUA). This EUA will remain in effect (meaning this  test can be used) for the duration of the COVID-19 declaration under Section 564(b)(1) of the Act, 21 U.S.C. section 360bbb-3(b)(1), unless the authorization is terminated or revoked.  Performed at [facility], [place] [address], [place], [place] [address]    CT Angio Chest PE W and/or Wo Contrast  Result Date: 11/23/2022 CLINICAL DATA:  High probability for PE. Abdominal pain. Elevated LFTs. EXAM: CT ANGIOGRAPHY CHEST CT ABDOMEN AND PELVIS WITH CONTRAST TECHNIQUE: Multidetector CT imaging of the chest was performed using the standard protocol during bolus administration of intravenous contrast. Multiplanar CT image reconstructions and MIPs were obtained to evaluate the vascular anatomy. Multidetector CT imaging of the abdomen and pelvis was performed using the standard protocol during bolus administration of intravenous contrast. RADIATION DOSE REDUCTION: This exam was performed according to the departmental dose-optimization program which includes automated exposure control, adjustment of the mA and/or kV according to patient size and/or use of iterative reconstruction technique. CONTRAST:  35m OMNIPAQUE IOHEXOL 350 MG/ML SOLN COMPARISON:  CT chest 08/06/2022 FINDINGS: CTA CHEST FINDINGS Cardiovascular: Satisfactory opacification of the pulmonary arteries to the segmental level. No evidence of pulmonary embolism. Normal heart size. No pericardial effusion. Mediastinum/Nodes: There is a mildly enlarged subcarinal lymph node measuring cm, new from prior. There are mildly enlarged bilateral hilar lymph nodes measuring up to 1 cm. Visualized esophagus and thyroid gland are within normal limits. Lungs/Pleura: Emphysematous changes are again seen and similar to prior. There is some central peribronchial wall thickening bilaterally. There is a small amount of patchy airspace disease in the left lung base/left lower lobe and within the lingula, new from prior. There is some scattered  tree-in-bud opacities  in the left lower lobe, new from prior. Calcified granuloma in the left upper lobe appears stable. Musculoskeletal: No chest wall abnormality. No acute or significant osseous findings. Review of the MIP images confirms the above findings. CT ABDOMEN and PELVIS FINDINGS Hepatobiliary: No focal liver abnormality is seen. Status post cholecystectomy. No biliary dilatation. Pancreas: Unremarkable. No pancreatic ductal dilatation or surrounding inflammatory changes. Spleen: Normal in size without focal abnormality. Adrenals/Urinary Tract: Adrenal glands are unremarkable. Kidneys are normal, without renal calculi, focal lesion, or hydronephrosis. Bladder is unremarkable. Stomach/Bowel: Stomach is within normal limits. Appendix appears normal. No evidence of bowel wall thickening, distention, or inflammatory changes. Vascular/Lymphatic: Aortic atherosclerosis. No enlarged abdominal or pelvic lymph nodes. Reproductive: Uterus and bilateral adnexa are unremarkable. Other: No abdominal wall hernia or abnormality. No abdominopelvic ascites. Musculoskeletal: Degenerative changes affect the spine. There is levoconvex curvature of the lumbar spine. Review of the MIP images confirms the above findings. IMPRESSION: 1. No evidence for pulmonary embolism. 2. New patchy and tree-in-bud opacities in the left lower lobe and lingula compatible with infection/inflammation. 3. New mild mediastinal and hilar lymphadenopathy, likely reactive. 4. No acute localizing process in the abdomen or pelvis. Aortic Atherosclerosis (ICD10-I70.0) and Emphysema (ICD10-J43.9). Electronically Signed   By: Ronney Asters M.D.   On: 11/23/2022 20:34   CT ABDOMEN PELVIS W CONTRAST  Result Date: 11/23/2022 CLINICAL DATA:  High probability for PE. Abdominal pain. Elevated LFTs. EXAM: CT ANGIOGRAPHY CHEST CT ABDOMEN AND PELVIS WITH CONTRAST TECHNIQUE: Multidetector CT imaging of the chest was performed using the standard protocol during bolus administration  of intravenous contrast. Multiplanar CT image reconstructions and MIPs were obtained to evaluate the vascular anatomy. Multidetector CT imaging of the abdomen and pelvis was performed using the standard protocol during bolus administration of intravenous contrast. RADIATION DOSE REDUCTION: This exam was performed according to the departmental dose-optimization program which includes automated exposure control, adjustment of the mA and/or kV according to patient size and/or use of iterative reconstruction technique. CONTRAST:  36m OMNIPAQUE IOHEXOL 350 MG/ML SOLN COMPARISON:  CT chest 08/06/2022 FINDINGS: CTA CHEST FINDINGS Cardiovascular: Satisfactory opacification of the pulmonary arteries to the segmental level. No evidence of pulmonary embolism. Normal heart size. No pericardial effusion. Mediastinum/Nodes: There is a mildly enlarged subcarinal lymph node measuring cm, new from prior. There are mildly enlarged bilateral hilar lymph nodes measuring up to 1 cm. Visualized esophagus and thyroid gland are within normal limits. Lungs/Pleura: Emphysematous changes are again seen and similar to prior. There is some central peribronchial wall thickening bilaterally. There is a small amount of patchy airspace disease in the left lung base/left lower lobe and within the lingula, new from prior. There is some scattered tree-in-bud opacities in the left lower lobe, new from prior. Calcified granuloma in the left upper lobe appears stable. Musculoskeletal: No chest wall abnormality. No acute or significant osseous findings. Review of the MIP images confirms the above findings. CT ABDOMEN and PELVIS FINDINGS Hepatobiliary: No focal liver abnormality is seen. Status post cholecystectomy. No biliary dilatation. Pancreas: Unremarkable. No pancreatic ductal dilatation or surrounding inflammatory changes. Spleen: Normal in size without focal abnormality. Adrenals/Urinary Tract: Adrenal glands are unremarkable. Kidneys are normal,  without renal calculi, focal lesion, or hydronephrosis. Bladder is unremarkable. Stomach/Bowel: Stomach is within normal limits. Appendix appears normal. No evidence of bowel wall thickening, distention, or inflammatory changes. Vascular/Lymphatic: Aortic atherosclerosis. No enlarged abdominal or pelvic lymph nodes. Reproductive: Uterus and bilateral adnexa are unremarkable. Other: No abdominal wall hernia or  abnormality. No abdominopelvic ascites. Musculoskeletal: Degenerative changes affect the spine. There is levoconvex curvature of the lumbar spine. Review of the MIP images confirms the above findings. IMPRESSION: 1. No evidence for pulmonary embolism. 2. New patchy and tree-in-bud opacities in the left lower lobe and lingula compatible with infection/inflammation. 3. New mild mediastinal and hilar lymphadenopathy, likely reactive. 4. No acute localizing process in the abdomen or pelvis. Aortic Atherosclerosis (ICD10-I70.0) and Emphysema (ICD10-J43.9). Electronically Signed   By: Ronney Asters M.D.   On: 11/23/2022 20:34   DG Chest 2 View  Result Date: 11/23/2022 CLINICAL DATA:  Worsening shortness of breath.  Chest pain. EXAM: CHEST - 2 VIEW COMPARISON:  Chest radiographs 07/14/2021 and 05/22/2021; CT abdomen and pelvis 08/06/2022 FINDINGS: Cardiac silhouette and mediastinal contours are within normal limits. There is flattening of the diaphragms and moderate hyperinflation there is increased patchy airspace opacities within the left greater than right mid and lower lungs, appearing to be greatest within the lingula. No pleural effusion or pneumothorax. Mild-to-moderate multilevel degenerative disc changes of the thoracic spine. IMPRESSION: 1. Increased patchy airspace opacities within the left greater than right mid and lower lungs, appearing to be greatest within the lingula. This is suspicious for multifocal pneumonia. Asymmetric edema can have a similar appearance. 2. COPD. Electronically Signed   By:  Yvonne Kendall M.D.   On: 11/23/2022 16:36    Pending Labs Unresulted Labs (From admission, onward)     Start     Ordered   11/23/22 2115  Blood gas, venous  Once,   R        11/23/22 2115   11/23/22 1807  Blood culture (routine x 2)  BLOOD CULTURE X 2,   R (with STAT occurrences)      11/23/22 1806            Vitals/Pain Today's Vitals   11/23/22 1558 11/23/22 1600 11/23/22 1756 11/23/22 1945  BP: 115/68  126/72 116/69  Pulse: (!) 109  (!) 103 (!) 104  Resp: 20  (!) 25 (!) 21  Temp: 98.3 F (36.8 C)     SpO2: 93%  93% 96%  Weight:  44.5 kg    Height:  '4\' 11"'$  (1.499 m)    PainSc:  0-No pain      Isolation Precautions No active isolations  Medications Medications  albuterol (PROVENTIL) (2.5 MG/3ML) 0.083% nebulizer solution 2.5 mg (has no administration in time range)  albuterol (PROVENTIL) (2.5 MG/3ML) 0.083% nebulizer solution 2.5 mg (2.5 mg Nebulization Patient Refused/Not Given 11/23/22 2101)  azithromycin (ZITHROMAX) 500 mg in sodium chloride 0.9 % 250 mL IVPB (has no administration in time range)  magnesium sulfate IVPB 2 g 50 mL (0 g Intravenous Stopped 11/23/22 1847)  albuterol (PROVENTIL) (2.5 MG/3ML) 0.083% nebulizer solution 10 mg (10 mg Nebulization Given 11/23/22 1756)  ipratropium (ATROVENT) nebulizer solution 1 mg (1 mg Nebulization Given 11/23/22 1756)  cefTRIAXone (ROCEPHIN) 1 g in sodium chloride 0.9 % 100 mL IVPB (0 g Intravenous Stopped 11/23/22 2209)  iohexol (OMNIPAQUE) 350 MG/ML injection 75 mL (75 mLs Intravenous Contrast Given 11/23/22 2005)    Mobility walks     Focused Assessments    R Recommendations: See Admitting Provider Note  Report given to:   Additional Notes: Patient A&Ox4, usually ambulatory, speaking complete sentences. Hx of COPD, O2 only stays at 92% at baseline.

## 2022-11-23 NOTE — ED Provider Notes (Signed)
[facility] Provider Note   CSN: [id number] Arrival date & time: 11/23/22  1555     History  Chief Complaint  Patient presents with   Shortness of Breath    COPD     Jodi Curtis is a 67 y.o. female history of COPD, here presenting with shortness of breath.  Patient is on 3 to 4 L at baseline.  Patient has worsening shortness of breath for the last several days.  Patient went to urgent care Jodi Curtis and was noted to be tachypneic.  She came in by EMS.  She received 1 neb at urgent care or another neb by EMS.  Patient also was given Solu-Medrol by EMS.  Patient states that she has no recent admission for COPD exacerbation.  The history is provided by the patient.       Home Medications Prior to Admission medications   Medication Sig Start Date End Date Taking? Authorizing Provider  amLODipine (NORVASC) 10 MG tablet Take 10 mg by mouth daily.    [provider]  aspirin EC 81 MG tablet Take 81 mg by mouth daily.    [provider]  atenolol (TENORMIN) 25 MG tablet Take 25 mg by mouth daily.     [provider]  BREZTRI AEROSPHERE 160-9-4.8 MCG/ACT AERO Inhale 2 puffs into the lungs 2 (two) times daily. 11/14/19   Jodi Ehrich, NP  Budeson-Glycopyrrol-Formoterol (BREZTRI AEROSPHERE) 160-9-4.8 MCG/ACT AERO Inhale 2 puffs into the lungs 2 (two) times daily. 10/12/19   Jodi Ehrich, NP  buPROPion (WELLBUTRIN) 100 MG tablet Take 100 mg by mouth 2 (two) times daily.    [provider]  citalopram (CELEXA) 10 MG tablet Take 10 mg by mouth daily.    [provider]  VENTOLIN HFA 108 (90 Base) MCG/ACT inhaler Inhale 2 puffs into the lungs every 4 (four) hours as needed. 02/09/19   [provider]      Allergies    Codeine, Penicillins, and Sulfa antibiotics    Review of Systems   Review of Systems  Respiratory:  Positive for shortness of breath.   All other systems reviewed and  are negative.   Physical Exam Updated Vital Signs BP 115/68   Pulse (!) 109   Temp 98.3 F (36.8 C)   Resp 20   Ht '4\' 11"'$  (1.499 m)   Wt 44.5 kg   SpO2 93%   BMI 19.79 kg/m  Physical Exam Vitals and nursing note reviewed.  Constitutional:      Comments: Slightly tachypneic  HENT:     Head: Normocephalic.  Eyes:     Extraocular Movements: Extraocular movements intact.     Pupils: Pupils are equal, round, and reactive to light.  Cardiovascular:     Rate and Rhythm: Normal rate and regular rhythm.  Pulmonary:     Comments: Slightly tachypneic and mild diffuse wheezing.  No retractions. Musculoskeletal:        General: Normal range of motion.     Cervical back: Normal range of motion and neck supple.  Skin:    General: Skin is warm.     Capillary Refill: Capillary refill takes less than 2 seconds.  Neurological:     General: No focal deficit present.     Mental Status: She is alert and oriented to person, place, and time.  Psychiatric:        Mood and Affect: Mood normal.        Behavior:  Behavior normal.     ED Results / Procedures / Treatments   Labs (all labs ordered are listed, but only abnormal results are displayed) Labs Reviewed  RESP PANEL BY RT-PCR (RSV, FLU A&B, COVID)  [id number]  CBC WITH DIFFERENTIAL/PLATELET  COMPREHENSIVE METABOLIC PANEL  BRAIN NATRIURETIC PEPTIDE  BLOOD GAS, VENOUS  TROPONIN I (HIGH SENSITIVITY)    EKG None  Radiology No results found.  Procedures Procedures    Medications Ordered in ED Medications  magnesium sulfate IVPB 2 g 50 mL (has no administration in time range)  albuterol (PROVENTIL) (2.5 MG/3ML) 0.083% nebulizer solution 10 mg (has no administration in time range)  ipratropium (ATROVENT) nebulizer solution 1 mg (has no administration in time range)    ED Course/ Medical Decision Making/ A&P                             Medical Decision Making Jodi Curtis is a 67 y.o. female here presenting with shortness of  breath.  Patient has history of COPD and has worsening shortness of breath.  Consider pneumonia versus COPD versus flu.  Consider PE as well.  Plan to get CBC and CMP and chest x-ray.  If chest x-ray showed mass, will likely need CTA chest.  9:48 PM Chest x-ray showed multifocal pneumonia.  CTA confirmed pneumonia.  Patient also had elevated LFTs but CT abdomen pelvis unremarkable.  At this point patient will be admitted for COPD and pneumonia.  Problems Addressed: Chronic obstructive pulmonary disease, unspecified COPD type ([place]): chronic illness or injury with exacerbation, progression, or side effects of treatment Community acquired pneumonia, unspecified laterality: acute illness or injury  Amount and/or Complexity of Data Reviewed Labs: ordered. Decision-making details documented in ED Course. Radiology: ordered and independent interpretation performed. Decision-making details documented in ED Course.  Risk Prescription drug management.    Final Clinical Impression(s) / ED Diagnoses Final diagnoses:  None    Rx / DC Orders ED Discharge Orders     None         Drenda Freeze, MD 11/23/22 2148

## 2022-11-23 NOTE — ED Triage Notes (Signed)
Pt to ED BIB [facility]. From urgent care, Apparently pt "felt bad" for one week. C/O SHOB,worse with exertion, reports hx of the same.  HX COPD. Received 2 breathing treatments, 1 at UC, 1 with EMS. #18LAC,  125 MG SOLUMEDROL given by EMS.   Last VS: 130/88 hr 116 , 97%3L.

## 2022-11-23 NOTE — H&P (Signed)
History and Physical    Patient: Jodi Curtis [id number] DOB: September 16, 1956 DOA: 11/23/2022 DOS: the patient was seen and examined on 11/24/2022 PCP: Rhea Bleacher, NP  Patient coming from: Home  Chief Complaint:  Chief Complaint  Patient presents with   Shortness of Breath    COPD    HPI: Jodi Curtis is a 67 y.o. female with medical history significant of COPD, anxiety and hypertension that presents for increasing shortness of breath.  Patient has had a cough since last Tuesday.  The family members had influenza and He was taking Tamiflu but it made her feel worse so she stopped taking it.  Denies having a fever, nausea, vomiting, diarrhea, headache or abdominal pain.  Endorses chest tightness.  She has been using her breathing treatments more often and had to turn her home oxygen from 3L to 5L.  Patient says she quit smoking greater than 10 years ago.  Husband passed away for COPD complications.   ED course: Initial vitals revealed tachycardia (109) and intermittent tachypnea satting in the low 90s on 3 L.  Lab workup reveals leukocytosis with left shift in cell lines, WBC 17.4, AST 140, ALT 104, ALP 264, troponin 15 (x 2), BNP 77.2, pH7.4 bicarb 36.1, CO2 38, blood cultures obtained, lactic acid 1.3 and COVID, influenza and RSV negative.  Chest x-ray concerning for multifocal pneumonia, CTA chest without evidence of PE, patchy and tree-in-bud opacities in the left lower lobe and lingula with reactive mediastinal and hilar lymphadenopathy.  CT abdomen pelvis showed no acute process.  Started off with continuous albuterol nebulizer with improvement.  She was given ceftriaxone and azithromycin IV as well as magnesium.    Review of Systems: As mentioned in the history of present illness. All other systems reviewed and are negative. Past Medical History:  Diagnosis Date   COPD (chronic obstructive pulmonary disease) ([place])    Hypertension    Past Surgical History:  Procedure Laterality  Date   CHOLECYSTECTOMY     TUBAL LIGATION     Social History:  reports that she quit smoking about 5 years ago. Her smoking use included cigarettes. She has a 30.00 pack-year smoking history. She has never used smokeless tobacco. She reports that she does not drink alcohol and does not use drugs.  Allergies  Allergen Reactions   Codeine    Penicillins Other (See Comments)    Internal bleeding   Sulfa Antibiotics     History reviewed. No pertinent family history.  Prior to Admission medications   Medication Sig Start Date End Date Taking? Authorizing Provider  amLODipine (NORVASC) 10 MG tablet Take 10 mg by mouth daily.    [provider]  aspirin EC 81 MG tablet Take 81 mg by mouth daily.    [provider]  atenolol (TENORMIN) 25 MG tablet Take 25 mg by mouth daily.     [provider]  BREZTRI AEROSPHERE 160-9-4.8 MCG/ACT AERO Inhale 2 puffs into the lungs 2 (two) times daily. 11/14/19   Martyn Ehrich, NP  Budeson-Glycopyrrol-Formoterol (BREZTRI AEROSPHERE) 160-9-4.8 MCG/ACT AERO Inhale 2 puffs into the lungs 2 (two) times daily. 10/12/19   Martyn Ehrich, NP  buPROPion (WELLBUTRIN) 100 MG tablet Take 100 mg by mouth 2 (two) times daily.    [provider]  citalopram (CELEXA) 10 MG tablet Take 10 mg by mouth daily.    [provider]  VENTOLIN HFA 108 (90 Base) MCG/ACT inhaler Inhale 2 puffs into the lungs every 4 (four)  hours as needed. 02/09/19   [provider]    Physical Exam: Vitals:   11/23/22 1600 11/23/22 1756 11/23/22 1945 11/23/22 2301  BP:  126/72 116/69   Pulse:  (!) 103 (!) 104   Resp:  (!) 25 (!) 21   Temp:    98 F (36.7 C)  TempSrc:    Oral  SpO2:  93% 96%   Weight: 44.5 kg     Height: '4\' 11"'$  (1.499 m)      GEN:     alert, female in no acute distress, speaking in full sentences without pause   HENT:  mucus membranes moist, oropharyngeal without lesions or erythema,  nares patent, no nasal  discharge  EYES:   pupils equal and reactive, EOM intact NECK:  supple, normal ROM RESP:   no increased work of breathing, decreased air movement bilaterally, frequent productive cough, wearing 3 L [place], tachypnea   CVS:   regular rate and rhythm, distal pulses intact   ABD:  soft, non-tender; bowel sounds present; no palpable masses EXT:   normal ROM, atraumatic, no edema, thin extremities   NEURO:  normal without focal findings,  speech normal, alert and oriented   Skin:   warm and dry, no rash on visible skin  Psych: Normal affect, appropriate speech and behavior   Data Reviewed:  There are no new results to review at this time. See ED course reviewed imaging and labs.     Assessment and Plan: Principal Problem:   Acute respiratory failure ([place]) Active Problems:   COPD with acute exacerbation ([facility])   CAP (community acquired pneumonia)   HTN (hypertension)   Anxiety   Acute hepatitis   Acute respiratory failure secondary to COPD and Community-acquired pneumonia -Admit to MedSurg -Scheduled DuoNebs every 4 hours -Albuterol nebulizers every 4 hours as needed -Follow-up blood cultures -Strep pneumo and Legionella urinary antigen -MRSA PCR nasal swab -Lovenox for VTE prophylaxis  Hypertension Normotensive on admission.  Continue home atenolol  Acute hepatitis She has elevation in her liver enzymes with normal bilirubin.  Had CT abdomen pelvis with contrast in the ED that did not show acute pathology. -Trend LFTs -Acute hepatitis panel  Anxiety Takes BuSpar and Celexa.  -Medications  Advance Care Planning:   Code Status: DNR, DNI discussion at bedside    Consults: None   Family Communication: Daughter at bedside   Severity of Illness: The appropriate patient status for this patient is INPATIENT. Inpatient status is judged to be reasonable and necessary in order to provide the required intensity of service to ensure the patient's safety. The patient's presenting  symptoms, physical exam findings, and initial radiographic and laboratory data in the context of their chronic comorbidities is felt to place them at high risk for further clinical deterioration. Furthermore, it is not anticipated that the patient will be medically stable for discharge from the hospital within 2 midnights of admission.   * I certify that at the point of admission it is my clinical judgment that the patient will require inpatient hospital care spanning beyond 2 midnights from the point of admission due to high intensity of service, high risk for further deterioration and high frequency of surveillance required.*  Author: Lyndee Hensen, DO 11/24/2022 12:19 AM  For on call review [email or web address].

## 2022-11-24 ENCOUNTER — Encounter (HOSPITAL_COMMUNITY): Payer: Self-pay | Admitting: Family Medicine

## 2022-11-24 DIAGNOSIS — I10 Essential (primary) hypertension: Secondary | ICD-10-CM | POA: Insufficient documentation

## 2022-11-24 DIAGNOSIS — J9601 Acute respiratory failure with hypoxia: Secondary | ICD-10-CM

## 2022-11-24 DIAGNOSIS — J189 Pneumonia, unspecified organism: Secondary | ICD-10-CM | POA: Insufficient documentation

## 2022-11-24 DIAGNOSIS — F419 Anxiety disorder, unspecified: Secondary | ICD-10-CM

## 2022-11-24 DIAGNOSIS — B179 Acute viral hepatitis, unspecified: Secondary | ICD-10-CM

## 2022-11-24 HISTORY — DX: Pneumonia, unspecified organism: J18.9

## 2022-11-24 HISTORY — DX: Essential (primary) hypertension: I10

## 2022-11-24 HISTORY — DX: Acute viral hepatitis, unspecified: B17.9

## 2022-11-24 HISTORY — DX: Anxiety disorder, unspecified: F41.9

## 2022-11-24 LAB — CBC
HCT: 31.7 % — ABNORMAL LOW (ref 36.0–46.0)
Hemoglobin: 10.3 g/dL — ABNORMAL LOW (ref 12.0–15.0)
MCH: 29.3 pg (ref 26.0–34.0)
MCHC: 32.5 g/dL (ref 30.0–36.0)
MCV: 90.3 fL (ref 80.0–100.0)
Platelets: 204 10*3/uL (ref 150–400)
RBC: 3.51 MIL/uL — ABNORMAL LOW (ref 3.87–5.11)
RDW: 12.3 % (ref 11.5–15.5)
WBC: 16.5 10*3/uL — ABNORMAL HIGH (ref 4.0–10.5)
nRBC: 0 % (ref 0.0–0.2)

## 2022-11-24 LAB — COMPREHENSIVE METABOLIC PANEL
ALT: 84 U/L — ABNORMAL HIGH (ref 0–44)
AST: 83 U/L — ABNORMAL HIGH (ref 15–41)
Albumin: 2.5 g/dL — ABNORMAL LOW (ref 3.5–5.0)
Alkaline Phosphatase: 226 U/L — ABNORMAL HIGH (ref 38–126)
Anion gap: 12 (ref 5–15)
BUN: 16 mg/dL (ref 8–23)
CO2: 32 mmol/L (ref 22–32)
Calcium: 9.2 mg/dL (ref 8.9–10.3)
Chloride: 96 mmol/L — ABNORMAL LOW (ref 98–111)
Creatinine, Ser: 0.69 mg/dL (ref 0.44–1.00)
GFR, Estimated: >60 mL/min (ref >60)
Glucose, Bld: 189 mg/dL — ABNORMAL HIGH (ref 70–99)
Potassium: 3.3 mmol/L — ABNORMAL LOW (ref 3.5–5.1)
Sodium: 140 mmol/L (ref 135–145)
Total Bilirubin: 0.5 mg/dL (ref 0.3–1.2)
Total Protein: 5.8 g/dL — ABNORMAL LOW (ref 6.5–8.1)

## 2022-11-24 LAB — HEPATITIS PANEL, ACUTE
HCV Ab: NONREACTIVE
Hep A IgM: NONREACTIVE
Hep B C IgM: NONREACTIVE
Hepatitis B Surface Ag: NONREACTIVE

## 2022-11-24 LAB — MRSA NEXT GEN BY PCR, NASAL: MRSA by PCR Next Gen: NOT DETECTED

## 2022-11-24 LAB — PROTIME-INR
INR: 1 (ref 0.8–1.2)
Prothrombin Time: 13.5 seconds (ref 11.4–15.2)

## 2022-11-24 LAB — HIV ANTIBODY (ROUTINE TESTING W REFLEX): HIV Screen 4th Generation wRfx: NONREACTIVE

## 2022-11-24 LAB — STREP PNEUMONIAE URINARY ANTIGEN: Strep Pneumo Urinary Antigen: NEGATIVE

## 2022-11-24 LAB — PROCALCITONIN: Procalcitonin: 0.85 ng/mL

## 2022-11-24 MED ORDER — SODIUM CHLORIDE 0.9 % IV SOLN: INTRAVENOUS | Status: DC

## 2022-11-24 MED ORDER — POLYETHYLENE GLYCOL 3350 17 G PO PACK: 17.0000 g | PACK | Freq: Every day | ORAL | Status: DC | PRN

## 2022-11-24 MED ORDER — IBUPROFEN 200 MG PO TABS: 400.0000 mg | ORAL_TABLET | Freq: Four times a day (QID) | ORAL | Status: DC | PRN

## 2022-11-24 MED ORDER — IPRATROPIUM-ALBUTEROL 0.5-2.5 (3) MG/3ML IN SOLN
3.0000 mL | RESPIRATORY_TRACT | Status: DC
  Administered 2022-11-24: 3 mL via RESPIRATORY_TRACT
  Filled 2022-11-24: qty 3

## 2022-11-24 MED ORDER — POTASSIUM CHLORIDE CRYS ER 20 MEQ PO TBCR
40.0000 meq | EXTENDED_RELEASE_TABLET | Freq: Once | ORAL | Status: AC
  Administered 2022-11-24: 40 meq via ORAL
  Filled 2022-11-24: qty 2

## 2022-11-24 MED ORDER — IPRATROPIUM-ALBUTEROL 0.5-2.5 (3) MG/3ML IN SOLN
3.0000 mL | Freq: Four times a day (QID) | RESPIRATORY_TRACT | Status: DC
  Administered 2022-11-24 – 2022-11-25 (×5): 3 mL via RESPIRATORY_TRACT
  Filled 2022-11-24 (×4): qty 3

## 2022-11-24 MED ORDER — ATENOLOL 50 MG PO TABS
25.0000 mg | ORAL_TABLET | Freq: Every day | ORAL | Status: DC
  Administered 2022-11-24 – 2022-11-25 (×2): 25 mg via ORAL
  Filled 2022-11-24 (×2): qty 1

## 2022-11-24 MED ORDER — SODIUM CHLORIDE 0.9 % IV SOLN
500.0000 mg | INTRAVENOUS | Status: DC
  Administered 2022-11-24: 500 mg via INTRAVENOUS
  Filled 2022-11-24: qty 5

## 2022-11-24 MED ORDER — BUSPIRONE HCL 5 MG PO TABS
5.0000 mg | ORAL_TABLET | Freq: Two times a day (BID) | ORAL | Status: DC
  Administered 2022-11-24 – 2022-11-25 (×3): 5 mg via ORAL
  Filled 2022-11-24 (×3): qty 1

## 2022-11-24 MED ORDER — SODIUM CHLORIDE 0.9 % IV SOLN
1.0000 g | INTRAVENOUS | Status: DC
  Administered 2022-11-24: 1 g via INTRAVENOUS
  Filled 2022-11-24: qty 10

## 2022-11-24 MED ORDER — CITALOPRAM HYDROBROMIDE 20 MG PO TABS
10.0000 mg | ORAL_TABLET | Freq: Every day | ORAL | Status: DC
  Administered 2022-11-24 – 2022-11-25 (×2): 10 mg via ORAL
  Filled 2022-11-24 (×2): qty 1

## 2022-11-24 MED ORDER — ENOXAPARIN SODIUM 30 MG/0.3ML IJ SOSY
30.0000 mg | PREFILLED_SYRINGE | INTRAMUSCULAR | Status: DC
  Administered 2022-11-24: 30 mg via SUBCUTANEOUS
  Filled 2022-11-24: qty 0.3

## 2022-11-24 MED ORDER — ENSURE ENLIVE PO LIQD
237.0000 mL | Freq: Three times a day (TID) | ORAL | Status: DC
  Administered 2022-11-24 – 2022-11-25 (×5): 237 mL via ORAL

## 2022-11-24 NOTE — Progress Notes (Signed)
PROGRESS NOTE    Jodi Curtis  [id number] DOB: Feb 01, 1956 DOA: 11/23/2022 PCP: Rhea Bleacher, NP   Brief Narrative:  HPI: Jodi Curtis is a 67 y.o. female with medical history significant of COPD, anxiety and hypertension that presents for increasing shortness of breath.  Patient has had a cough since last Tuesday.  The family members had influenza and He was taking Tamiflu but it made her feel worse so she stopped taking it.  Denies having a fever, nausea, vomiting, diarrhea, headache or abdominal pain.  Endorses chest tightness.  She has been using her breathing treatments more often and had to turn her home oxygen from 3L to 5L.   Patient says she quit smoking greater than 10 years ago.  Husband passed away for COPD complications.    ED course: Initial vitals revealed tachycardia (109) and intermittent tachypnea satting in the low 90s on 3 L.  Lab workup reveals leukocytosis with left shift in cell lines, WBC 17.4, AST 140, ALT 104, ALP 264, troponin 15 (x 2), BNP 77.2, pH7.4 bicarb 36.1, CO2 38, blood cultures obtained, lactic acid 1.3 and COVID, influenza and RSV negative.  Chest x-ray concerning for multifocal pneumonia, CTA chest without evidence of PE, patchy and tree-in-bud opacities in the left lower lobe and lingula with reactive mediastinal and hilar lymphadenopathy.  CT abdomen pelvis showed no acute process.  Started off with continuous albuterol nebulizer with improvement.  She was given ceftriaxone and azithromycin IV as well as magnesium.   Assessment & Plan:   Principal Problem:   Acute respiratory failure (HCC) Active Problems:   COPD with acute exacerbation (HCC)   CAP (community acquired pneumonia)   HTN (hypertension)   Anxiety   Acute hepatitis  chronic respiratory failure with hypoxia secondary to community-acquired pneumonia and acute COPD exacerbation: Patient states that she feels better than yesterday.  She tells me that she chronically uses 3 to 4 L of  oxygen, currently she is on 4 L of oxygen, acute respiratory failure with hypoxia ruled out.  She does not have wheezes on my examination, she was also admitted presumably for COPD exacerbation but no steroids were ordered.  I do not think steroids are needed either.  She does not feel comfortable going home yet.  I will continue current antibiotics of Rocephin and Zithromax and monitor another day.  Continue bronchodilators.  Will check procalcitonin.  Urine antigen for streptococci and Legionella are ordered.  Hypokalemia: Will replace.  Essential hypertension: Controlled.  Continue home atenolol.  Anxiety: Continue home medications.  Elevated LFTs: Source unknown.  Viral hepatitis panel pending.  LFTs improving.  DVT prophylaxis: enoxaparin (LOVENOX) injection 30 mg Start: 11/24/22 2200   Code Status: Full Code  Family Communication:  None present at bedside.  Plan of care discussed with patient in length and he/she verbalized understanding and agreed with it.  Status is: Inpatient Remains inpatient appropriate because: Does not feel back to baseline yet, will likely discharge tomorrow.   Estimated body mass index is 19.79 kg/m as calculated from the following:   Height as of this encounter: '4\' 11"'$  (1.499 m).   Weight as of this encounter: 44.5 kg.    Nutritional Assessment: Body mass index is 19.79 kg/m.Marland Kitchen Seen by dietician.  I agree with the assessment and plan as outlined below: Nutrition Status:        . Skin Assessment: I have examined the patient's skin and I agree with the wound assessment as performed by the wound care  RN as outlined below:    Consultants:  None  Procedures:  None  Antimicrobials:  Anti-infectives (From admission, onward)    Start     Dose/Rate Route Frequency Ordered Stop   11/24/22 2300  azithromycin (ZITHROMAX) 500 mg in sodium chloride 0.9 % 250 mL IVPB        500 mg 250 mL/hr over 60 Minutes Intravenous Every 24 hours 11/24/22 0030  11/28/22 2259   11/24/22 2100  cefTRIAXone (ROCEPHIN) 1 g in sodium chloride 0.9 % 100 mL IVPB        1 g 200 mL/hr over 30 Minutes Intravenous Every 24 hours 11/24/22 0030 11/28/22 2059   11/23/22 1930  cefTRIAXone (ROCEPHIN) 1 g in sodium chloride 0.9 % 100 mL IVPB        1 g 200 mL/hr over 30 Minutes Intravenous  Once 11/23/22 1921 11/23/22 2209   11/23/22 1930  azithromycin (ZITHROMAX) 500 mg in sodium chloride 0.9 % 250 mL IVPB        500 mg 250 mL/hr over 60 Minutes Intravenous  Once 11/23/22 1921 11/24/22 0038         Subjective: Patient seen and examined.  Still with shortness of breath but better than yesterday.  No other complaint.  Objective: Vitals:   11/24/22 0020 11/24/22 0104 11/24/22 0410 11/24/22 0748  BP: 121/60  117/71 104/64  Pulse: 98  91 84  Resp: 17  (!) 21 15  Temp: 97.6 F (36.4 C)  98 F (36.7 C) 97.7 F (36.5 C)  TempSrc: Oral  Oral Oral  SpO2: 94% 96% 90% 98%  Weight:      Height:        Intake/Output Summary (Last 24 hours) at 11/24/2022 [id number] Last data filed at 11/24/2022 0600 Gross per 24 hour  Intake --  Output 100 ml  Net -100 ml   Filed Weights   11/23/22 1600  Weight: 44.5 kg    Examination:  General exam: Appears calm and comfortable  Respiratory system: Diminished breath sounds, mostly on the left middle and lower lobe.  Respiratory effort normal. Cardiovascular system: S1 & S2 heard, RRR. No JVD, murmurs, rubs, gallops or clicks. No pedal edema. Gastrointestinal system: Abdomen is nondistended, soft and nontender. No organomegaly or masses felt. Normal bowel sounds heard. Central nervous system: Alert and oriented. No focal neurological deficits. Extremities: Symmetric 5 x 5 power. Skin: No rashes, lesions or ulcers Psychiatry: Judgement and insight appear normal. Mood & affect appropriate.    Data Reviewed: I have personally reviewed following labs and imaging studies  CBC: Recent Labs  Lab 11/23/22 1713 11/23/22 1810  11/24/22 0256  WBC 17.4*  --  16.5*  NEUTROABS 15.3*  --   --   HGB 11.9* 12.2 10.3*  HCT 37.6 36.0 31.7*  MCV 93.1  --  90.3  PLT 240  --  0000000   Basic Metabolic Panel: Recent Labs  Lab 11/23/22 1713 11/23/22 1810 11/24/22 0256  NA 139 136 140  K 3.5 3.3* 3.3*  CL 94*  --  96*  CO2 29  --  32  GLUCOSE 126*  --  189*  BUN 16  --  16  CREATININE 0.77  --  0.69  CALCIUM 9.4  --  9.2   GFR: Estimated Creatinine Clearance: 47.8 mL/min (by C-G formula based on SCr of 0.69 mg/dL). Liver Function Tests: Recent Labs  Lab 11/23/22 1713 11/24/22 0256  AST 140* 83*  ALT 104* 84*  ALKPHOS 264* 226*  BILITOT 1.0 0.5  PROT 6.6 5.8*  ALBUMIN 2.9* 2.5*   No results for input(s): "LIPASE", "AMYLASE" in the last 168 hours. No results for input(s): "AMMONIA" in the last 168 hours. Coagulation Profile: Recent Labs  Lab 11/24/22 0256  INR 1.0   Cardiac Enzymes: No results for input(s): "CKTOTAL", "CKMB", "CKMBINDEX", "TROPONINI" in the last 168 hours. BNP (last 3 results) No results for input(s): "PROBNP" in the last 8760 hours. HbA1C: No results for input(s): "HGBA1C" in the last 72 hours. CBG: No results for input(s): "GLUCAP" in the last 168 hours. Lipid Profile: No results for input(s): "CHOL", "HDL", "LDLCALC", "TRIG", "CHOLHDL", "LDLDIRECT" in the last 72 hours. Thyroid Function Tests: No results for input(s): "TSH", "T4TOTAL", "FREET4", "T3FREE", "THYROIDAB" in the last 72 hours. Anemia Panel: No results for input(s): "VITAMINB12", "FOLATE", "FERRITIN", "TIBC", "IRON", "RETICCTPCT" in the last 72 hours. Sepsis Labs: Recent Labs  Lab 11/23/22 1935  LATICACIDVEN 1.3    Recent Results (from the past 240 hour(s))  Resp panel by RT-PCR (RSV, Flu A&B, Covid) Anterior Nasal Swab     Status: None   Collection Time: 11/23/22  8:49 PM   Specimen: Anterior Nasal Swab  Result Value Ref Range Status   SARS Coronavirus 2 by RT PCR NEGATIVE NEGATIVE Final   Influenza A by  PCR NEGATIVE NEGATIVE Final   Influenza B by PCR NEGATIVE NEGATIVE Final    Comment: (NOTE) The Xpert Xpress SARS-CoV-2/FLU/RSV plus assay is intended as an aid in the diagnosis of influenza from Nasopharyngeal swab specimens and should not be used as a sole basis for treatment. Nasal washings and aspirates are unacceptable for Xpert Xpress SARS-CoV-2/FLU/RSV testing.  Fact Sheet for Patients: EntrepreneurPulse.com.au  Fact Sheet for Healthcare Providers: IncredibleEmployment.be  This test is not yet approved or cleared by the [place] FDA and has been authorized for detection and/or diagnosis of SARS-CoV-2 by FDA under an Emergency Use Authorization (EUA). This EUA will remain in effect (meaning this test can be used) for the duration of the COVID-19 declaration under Section 564(b)(1) of the Act, 21 U.S.C. section 360bbb-3(b)(1), unless the authorization is terminated or revoked.     Resp Syncytial Virus by PCR NEGATIVE NEGATIVE Final    Comment: (NOTE) Fact Sheet for Patients: EntrepreneurPulse.com.au  Fact Sheet for Healthcare Providers: IncredibleEmployment.be  This test is not yet approved or cleared by the [place] FDA and has been authorized for detection and/or diagnosis of SARS-CoV-2 by FDA under an Emergency Use Authorization (EUA). This EUA will remain in effect (meaning this test can be used) for the duration of the COVID-19 declaration under Section 564(b)(1) of the Act, 21 U.S.C. section 360bbb-3(b)(1), unless the authorization is terminated or revoked.  Performed at [facility], [place] [address], [place], [place] [address]   MRSA Next Gen by PCR, Nasal     Status: None   Collection Time: 11/24/22 12:15 AM  Result Value Ref Range Status   MRSA by PCR Next Gen NOT DETECTED NOT DETECTED Final    Comment: (NOTE) The GeneXpert MRSA Assay (FDA approved for NASAL specimens  only), is one component of a comprehensive MRSA colonization surveillance program. It is not intended to diagnose MRSA infection nor to guide or monitor treatment for MRSA infections. Test performance is not FDA approved in patients less than 77 years old. Performed at [facility], [place] [address] [place], [place] [address]      Radiology Studies: CT Angio Chest PE W and/or Wo Contrast  Result Date: 11/23/2022 CLINICAL DATA:  High probability for PE. Abdominal pain. Elevated LFTs. EXAM: CT ANGIOGRAPHY CHEST CT ABDOMEN AND PELVIS WITH CONTRAST TECHNIQUE: Multidetector CT imaging of the chest was performed using the standard protocol during bolus administration of intravenous contrast. Multiplanar CT image reconstructions and MIPs were obtained to evaluate the vascular anatomy. Multidetector CT imaging of the abdomen and pelvis was performed using the standard protocol during bolus administration of intravenous contrast. RADIATION DOSE REDUCTION: This exam was performed according to the departmental dose-optimization program which includes automated exposure control, adjustment of the mA and/or kV according to patient size and/or use of iterative reconstruction technique. CONTRAST:  39m OMNIPAQUE IOHEXOL 350 MG/ML SOLN COMPARISON:  CT chest 08/06/2022 FINDINGS: CTA CHEST FINDINGS Cardiovascular: Satisfactory opacification of the pulmonary arteries to the segmental level. No evidence of pulmonary embolism. Normal heart size. No pericardial effusion. Mediastinum/Nodes: There is a mildly enlarged subcarinal lymph node measuring cm, new from prior. There are mildly enlarged bilateral hilar lymph nodes measuring up to 1 cm. Visualized esophagus and thyroid gland are within normal limits. Lungs/Pleura: Emphysematous changes are again seen and similar to prior. There is some central peribronchial wall thickening bilaterally. There is a small amount of patchy airspace disease in the left lung base/left  lower lobe and within the lingula, new from prior. There is some scattered tree-in-bud opacities in the left lower lobe, new from prior. Calcified granuloma in the left upper lobe appears stable. Musculoskeletal: No chest wall abnormality. No acute or significant osseous findings. Review of the MIP images confirms the above findings. CT ABDOMEN and PELVIS FINDINGS Hepatobiliary: No focal liver abnormality is seen. Status post cholecystectomy. No biliary dilatation. Pancreas: Unremarkable. No pancreatic ductal dilatation or surrounding inflammatory changes. Spleen: Normal in size without focal abnormality. Adrenals/Urinary Tract: Adrenal glands are unremarkable. Kidneys are normal, without renal calculi, focal lesion, or hydronephrosis. Bladder is unremarkable. Stomach/Bowel: Stomach is within normal limits. Appendix appears normal. No evidence of bowel wall thickening, distention, or inflammatory changes. Vascular/Lymphatic: Aortic atherosclerosis. No enlarged abdominal or pelvic lymph nodes. Reproductive: Uterus and bilateral adnexa are unremarkable. Other: No abdominal wall hernia or abnormality. No abdominopelvic ascites. Musculoskeletal: Degenerative changes affect the spine. There is levoconvex curvature of the lumbar spine. Review of the MIP images confirms the above findings. IMPRESSION: 1. No evidence for pulmonary embolism. 2. New patchy and tree-in-bud opacities in the left lower lobe and lingula compatible with infection/inflammation. 3. New mild mediastinal and hilar lymphadenopathy, likely reactive. 4. No acute localizing process in the abdomen or pelvis. Aortic Atherosclerosis (ICD10-I70.0) and Emphysema (ICD10-J43.9). Electronically Signed   By: ARonney AstersM.D.   On: 11/23/2022 20:34   CT ABDOMEN PELVIS W CONTRAST  Result Date: 11/23/2022 CLINICAL DATA:  High probability for PE. Abdominal pain. Elevated LFTs. EXAM: CT ANGIOGRAPHY CHEST CT ABDOMEN AND PELVIS WITH CONTRAST TECHNIQUE:  Multidetector CT imaging of the chest was performed using the standard protocol during bolus administration of intravenous contrast. Multiplanar CT image reconstructions and MIPs were obtained to evaluate the vascular anatomy. Multidetector CT imaging of the abdomen and pelvis was performed using the standard protocol during bolus administration of intravenous contrast. RADIATION DOSE REDUCTION: This exam was performed according to the departmental dose-optimization program which includes automated exposure control, adjustment of the mA and/or kV according to patient size and/or use of iterative reconstruction technique. CONTRAST:  750mOMNIPAQUE IOHEXOL 350 MG/ML SOLN COMPARISON:  CT chest 08/06/2022 FINDINGS: CTA CHEST FINDINGS Cardiovascular: Satisfactory opacification of the pulmonary arteries to the  segmental level. No evidence of pulmonary embolism. Normal heart size. No pericardial effusion. Mediastinum/Nodes: There is a mildly enlarged subcarinal lymph node measuring cm, new from prior. There are mildly enlarged bilateral hilar lymph nodes measuring up to 1 cm. Visualized esophagus and thyroid gland are within normal limits. Lungs/Pleura: Emphysematous changes are again seen and similar to prior. There is some central peribronchial wall thickening bilaterally. There is a small amount of patchy airspace disease in the left lung base/left lower lobe and within the lingula, new from prior. There is some scattered tree-in-bud opacities in the left lower lobe, new from prior. Calcified granuloma in the left upper lobe appears stable. Musculoskeletal: No chest wall abnormality. No acute or significant osseous findings. Review of the MIP images confirms the above findings. CT ABDOMEN and PELVIS FINDINGS Hepatobiliary: No focal liver abnormality is seen. Status post cholecystectomy. No biliary dilatation. Pancreas: Unremarkable. No pancreatic ductal dilatation or surrounding inflammatory changes. Spleen: Normal in  size without focal abnormality. Adrenals/Urinary Tract: Adrenal glands are unremarkable. Kidneys are normal, without renal calculi, focal lesion, or hydronephrosis. Bladder is unremarkable. Stomach/Bowel: Stomach is within normal limits. Appendix appears normal. No evidence of bowel wall thickening, distention, or inflammatory changes. Vascular/Lymphatic: Aortic atherosclerosis. No enlarged abdominal or pelvic lymph nodes. Reproductive: Uterus and bilateral adnexa are unremarkable. Other: No abdominal wall hernia or abnormality. No abdominopelvic ascites. Musculoskeletal: Degenerative changes affect the spine. There is levoconvex curvature of the lumbar spine. Review of the MIP images confirms the above findings. IMPRESSION: 1. No evidence for pulmonary embolism. 2. New patchy and tree-in-bud opacities in the left lower lobe and lingula compatible with infection/inflammation. 3. New mild mediastinal and hilar lymphadenopathy, likely reactive. 4. No acute localizing process in the abdomen or pelvis. Aortic Atherosclerosis (ICD10-I70.0) and Emphysema (ICD10-J43.9). Electronically Signed   By: Ronney Asters M.D.   On: 11/23/2022 20:34   DG Chest 2 View  Result Date: 11/23/2022 CLINICAL DATA:  Worsening shortness of breath.  Chest pain. EXAM: CHEST - 2 VIEW COMPARISON:  Chest radiographs 07/14/2021 and 05/22/2021; CT abdomen and pelvis 08/06/2022 FINDINGS: Cardiac silhouette and mediastinal contours are within normal limits. There is flattening of the diaphragms and moderate hyperinflation there is increased patchy airspace opacities within the left greater than right mid and lower lungs, appearing to be greatest within the lingula. No pleural effusion or pneumothorax. Mild-to-moderate multilevel degenerative disc changes of the thoracic spine. IMPRESSION: 1. Increased patchy airspace opacities within the left greater than right mid and lower lungs, appearing to be greatest within the lingula. This is suspicious for  multifocal pneumonia. Asymmetric edema can have a similar appearance. 2. COPD. Electronically Signed   By: Yvonne Kendall M.D.   On: 11/23/2022 16:36    Scheduled Meds:  atenolol  25 mg Oral Daily   busPIRone  5 mg Oral BID   citalopram  10 mg Oral Daily   enoxaparin (LOVENOX) injection  30 mg Subcutaneous Q24H   feeding supplement  237 mL Oral TID BM   ipratropium-albuterol  3 mL Nebulization Q6H   potassium chloride  40 mEq Oral Once   Continuous Infusions:  azithromycin (ZITHROMAX) 500 mg in sodium chloride 0.9 % 250 mL IVPB     cefTRIAXone (ROCEPHIN)  IV       LOS: 1 day   Darliss Cheney, MD [facility]  11/24/2022, 7:52 AM   *Please note that this is a verbal dictation therefore any spelling or grammatical errors are due to the "[place]" system interpretation.  Please page via [place] and do not message via secure chat for urgent patient care matters. Secure chat can be used for non urgent patient care matters.  How to contact the [facility] Attending or Consulting provider [place] or covering provider during after hours [place], for this patient?  Check the care team in [facility] and look for a) attending/consulting TRH provider listed and b) the [facility] team listed. Page or secure chat [phone]. Log into [email or web address] and use [place]'s universal password to access. If you do not have the password, please contact the hospital operator. Locate the [facility] provider you are looking for under [facility] and page to a number that you can be directly reached. If you still have difficulty reaching the provider, please page the [facility] (Director on Call) for the Hospitalists listed on amion for assistance.

## 2022-11-24 NOTE — Progress Notes (Signed)
New admission this evening. This RN was verifying and had discussion about patient's DNR/DNI status. She would like to be treated as a FULL code. We discussed what both measures look like and this RN verified once again with patient. Patient states she would want full treatment measures if an emergency were to happen. I relayed this information to Hospitalist. Dr. [place] Crane was notified.

## 2022-11-24 NOTE — Progress Notes (Signed)
Mobility Specialist - Progress Note   11/24/22 1456  Mobility  Activity Ambulated with assistance in room  Level of Assistance Moderate assist, patient does 50-74%  Assistive Device Front wheel walker  Distance Ambulated (ft) 15 ft  Activity Response Tolerated fair  Mobility Referral Yes  $Mobility charge 1 Mobility   Pt was received in bed and agreeable to mobility. Pt was ModA throughout session. Session limited d/t pt fatigue and SOB. Pt was returned to bed with all needs met.  Franki Monte  Mobility Specialist Please contact via Solicitor or Rehab office at [phone]

## 2022-11-24 NOTE — Progress Notes (Addendum)
The patient made known to her bedside RN Darlina Sicilian that she would like her code status to be reversed to Full Code.  The patient is alert and oriented x 4.  Presented at bedside.  The patient is alert oriented x 4.  She states she feels better however her cough is persistent.    We discussed goals of care.  She confirms her CODE STATUS which is full code.    On physical exam she has mild rales at bases.  Quit smoking more than 10 years ago.  Congratulated her on her accomplishment.    She has no other complaints at this time.  Time: 15 minutes, including goals of care discussion.

## 2022-11-25 ENCOUNTER — Other Ambulatory Visit (HOSPITAL_COMMUNITY): Payer: Self-pay

## 2022-11-25 DIAGNOSIS — J9601 Acute respiratory failure with hypoxia: Secondary | ICD-10-CM | POA: Diagnosis not present

## 2022-11-25 LAB — CBC WITH DIFFERENTIAL/PLATELET
Abs Immature Granulocytes: 0.84 K/uL — ABNORMAL HIGH (ref 0.00–0.07)
Basophils Absolute: 0.1 K/uL (ref 0.0–0.1)
Basophils Relative: 1 %
Eosinophils Absolute: 0 K/uL (ref 0.0–0.5)
Eosinophils Relative: 0 %
HCT: 35.1 % — ABNORMAL LOW (ref 36.0–46.0)
Hemoglobin: 11 g/dL — ABNORMAL LOW (ref 12.0–15.0)
Immature Granulocytes: 4 %
Lymphocytes Relative: 8 %
Lymphs Abs: 1.6 K/uL (ref 0.7–4.0)
MCH: 29.3 pg (ref 26.0–34.0)
MCHC: 31.3 g/dL (ref 30.0–36.0)
MCV: 93.4 fL (ref 80.0–100.0)
Monocytes Absolute: 1.6 K/uL — ABNORMAL HIGH (ref 0.1–1.0)
Monocytes Relative: 8 %
Neutro Abs: 16.3 K/uL — ABNORMAL HIGH (ref 1.7–7.7)
Neutrophils Relative %: 79 %
Platelets: 292 K/uL (ref 150–400)
RBC: 3.76 MIL/uL — ABNORMAL LOW (ref 3.87–5.11)
RDW: 12.7 % (ref 11.5–15.5)
WBC: 20.4 K/uL — ABNORMAL HIGH (ref 4.0–10.5)
nRBC: 0 % (ref 0.0–0.2)

## 2022-11-25 LAB — COMPREHENSIVE METABOLIC PANEL WITH GFR
ALT: 83 U/L — ABNORMAL HIGH (ref 0–44)
AST: 66 U/L — ABNORMAL HIGH (ref 15–41)
Albumin: 2.5 g/dL — ABNORMAL LOW (ref 3.5–5.0)
Alkaline Phosphatase: 231 U/L — ABNORMAL HIGH (ref 38–126)
Anion gap: 10 (ref 5–15)
BUN: 19 mg/dL (ref 8–23)
CO2: 33 mmol/L — ABNORMAL HIGH (ref 22–32)
Calcium: 9.4 mg/dL (ref 8.9–10.3)
Chloride: 100 mmol/L (ref 98–111)
Creatinine, Ser: 0.66 mg/dL (ref 0.44–1.00)
GFR, Estimated: >60 mL/min
Glucose, Bld: 100 mg/dL — ABNORMAL HIGH (ref 70–99)
Potassium: 3.9 mmol/L (ref 3.5–5.1)
Sodium: 143 mmol/L (ref 135–145)
Total Bilirubin: 0.5 mg/dL (ref 0.3–1.2)
Total Protein: 5.9 g/dL — ABNORMAL LOW (ref 6.5–8.1)

## 2022-11-25 MED ORDER — PREDNISONE 20 MG PO TABS
50.0000 mg | ORAL_TABLET | Freq: Every day | ORAL | Status: DC
  Administered 2022-11-25: 50 mg via ORAL
  Filled 2022-11-25: qty 1

## 2022-11-25 MED ORDER — MELATONIN 5 MG PO TABS
5.0000 mg | ORAL_TABLET | Freq: Once | ORAL | Status: AC
  Administered 2022-11-25: 5 mg via ORAL
  Filled 2022-11-25: qty 1

## 2022-11-25 MED ORDER — PREDNISONE 50 MG PO TABS
50.0000 mg | ORAL_TABLET | Freq: Every day | ORAL | 0 refills | Status: AC
  Filled 2022-11-25: qty 4, 4d supply, fill #0

## 2022-11-25 MED ORDER — CEFDINIR 300 MG PO CAPS
300.0000 mg | ORAL_CAPSULE | Freq: Two times a day (BID) | ORAL | 0 refills | Status: AC
  Filled 2022-11-25: qty 10, 5d supply, fill #0

## 2022-11-25 NOTE — Progress Notes (Cosign Needed)
    Durable Medical Equipment  (From admission, onward)           Start     Ordered   11/25/22 1159  For home use only DME 4 wheeled rolling walker with seat  Once       Question:  Patient needs a walker to treat with the following condition  Answer:  COPD (chronic obstructive pulmonary disease) ([place])   11/25/22 1159   11/25/22 1033  For home use only DME Walker rolling  Once       Question Answer Comment  Walker: With Zearing Wheels   Patient needs a walker to treat with the following condition Balance disorder      11/25/22 1032   11/25/22 1033  For home use only DME Bedside commode  Once       Question:  Patient needs a bedside commode to treat with the following condition  Answer:  Balance problem   11/25/22 1032

## 2022-11-25 NOTE — Progress Notes (Signed)
Discharge instructions reviewed with pt and her daughter. Pt verbalized understanding  Copy of instructions given to pt/daughter. TOC Pharmacy filling 2 scripts for pt and will be delivered to pt's room. CM has ordered pt's DME and will be delivered to pt's room.  Pt is on O2, has her own O2 tank at the bedside for discharge home.    Await delivery of DME.

## 2022-11-25 NOTE — Discharge Summary (Addendum)
Physician Discharge Summary  Jodi Curtis [id number] DOB: 09/19/1956 DOA: 11/23/2022  PCP: Rhea Bleacher, NP  Admit date: 11/23/2022 Discharge date: 11/25/2022 30 Day Unplanned Readmission Risk Score    Flowsheet Row ED to Hosp-Admission (Current) from 11/23/2022 in [facility]  30 Day Unplanned Readmission Risk Score (%) 7.19 Filed at 11/25/2022 0801       This score is the patient's risk of an unplanned readmission within 30 days of being discharged (0 -100%). The score is based on dignosis, age, lab data, medications, orders, and past utilization.   Low:  0-14.9   Medium: 15-21.9   High: 22-29.9   Extreme: 30 and above          Admitted From: Home Disposition: Home  Recommendations for Outpatient Follow-up:  Follow up with PCP in 1-2 weeks Please obtain BMP/CBC in one week Please follow up with your PCP on the following pending results: Unresulted Labs (From admission, onward)     Start     Ordered   11/24/22 0009  Legionella Pneumophila Serogp 1 Ur Ag  (COPD / Pneumonia / Cellulitis / Lower Extremity Wound)  Once,   R        11/24/22 0015              Home Health: Yes Equipment/Devices: Rolling walker and bedside commode   Discharge Condition: Stable CODE STATUS: Full code Diet recommendation: Cardiac  Subjective: Seen and examined.  She states that she is doing better than yesterday.  She feels comfortable going home.  PT tech was in the room during my encounter.  Brief/Interim Summary: Jodi Curtis is a 67 y.o. female with medical history significant of COPD, anxiety and hypertension that presented for increasing shortness of breath and cough. The family members had influenza and she was taking Tamiflu but it made her feel worse so she stopped taking it.  Denies having a fever, nausea, vomiting, diarrhea, headache or abdominal pain. She has been using her breathing treatments more often and had to turn her home oxygen from 3L to 5L.  Patient  says she quit smoking greater than 10 years ago.  Husband passed away for COPD complications.    ED course: Initial vitals revealed tachycardia (109) and intermittent tachypnea satting in the low 90s on 3 L.  Lab workup reveals leukocytosis with left shift in cell lines, WBC 17.4, AST 140, ALT 104, ALP 264, troponin 15 (x 2), BNP 77.2, pH7.4 bicarb 36.1, CO2 38, blood cultures obtained, lactic acid 1.3 and COVID, influenza and RSV negative.  Chest x-ray concerning for multifocal pneumonia, CTA chest without evidence of PE, patchy and tree-in-bud opacities in the left lower lobe and lingula with reactive mediastinal and hilar lymphadenopathy.  CT abdomen pelvis showed no acute process.  Started off with continuous albuterol nebulizer with improvement.  She was given ceftriaxone and azithromycin IV as well as magnesium.    chronic respiratory failure with hypoxia and sepsis secondary to community-acquired pneumonia and acute COPD exacerbation: Patient met criteria for sepsis based on tachycardia, tachypnea and leukocytosis.  She has continued to feel better.  She has been requiring 4 L of oxygen, she says that she needs her baseline requirement.  She did not have any wheezes yesterday, she was initially admitted with COPD exacerbation but she was never started on prednisone, but due to not having any wheezes yesterday, I also did not start her on any prednisone however today she has very faint end expiratory wheezes  so I have started her on prednisone and I am going to prescribe her for 4 more days at discharge.  She was on Rocephin and Zithromax for pneumonia and she is going to be discharged on 5 days of oral cefdinir.  Continue rest of the medications.  Her procalcitonin was 0.85.  Her leukocytosis went up, likely due to dose of prednisone that she received.  She has remained afebrile.  It looks like she has always had leukocytosis going all the way back from 3 years ago.   Hypokalemia: Repleted and  resolved.   Essential hypertension: Controlled.  Continue home atenolol.   Anxiety: Continue home medications.   Elevated LFTs: Source unknown.  Viral hepatitis panel negative.  LFTs improving.  Repeat LFTs in 1 week at PCPs office.  Addendum: After the discharge was completed.  I received a message from the nurse that patient's family has concerns about the discharge.  I ended up speaking to her daughter Uvaldo Rising.  We had lengthy discussion.  Roselyn Reef was concerned that patient is not stable for discharge.  She also was concerned that patient was not seen by the physical therapy and that patient is requiring more oxygen.  She also said that patient tends to not use breathing treatments at home but she is getting it here and that was one of her concern.  Regarding that, I respectfully informed her that unfortunately, we cannot control any patient's behavior at home and that it is patient's responsibility and perhaps somewhat family's responsibility to make sure that they remain compliant with the medications and medical recommendations.  I did inform her that patient has been requiring 4 L of oxygen throughout the hospitalization and that is her baseline.  We have not noted and I have not been informed of any excessive oxygen requirement.  I also informed her that contrary to her knowledge, patient was in fact seen by physical therapy today and they recommended home health, walker as well as bedside commode which has been ordered for her.  Medically, patient appears to be stable and patient also stated that she was feeling better.  I did inform her that I have no medical necessity to keep her in the hospital however for the sake of her peace of mind, I offered to check ambulatory oximetry however daughter was furious and did not want me to do that and said " okay, go ahead and discharge her, if she did not do well at home, I will bring her back and I will be looking for you" and after that, she hung up  the phone while I was still talking. I then reached out to PT OT as well as TOC, all of them talked to the daughter at the bedside and reassured her that her mother has received all the necessary services that she deserved and that DME will as soon as possible as well.  After all that, she was calm down, I then spoke to her over the phone myself, she indeed was calm down and was reassured and agreed with the discharge.  Discharge Diagnoses:  Principal Problem:   Acute respiratory failure ([place]) Active Problems:   COPD with acute exacerbation ([place])   CAP (community acquired pneumonia)   HTN (hypertension)   Anxiety   Acute hepatitis    Discharge Instructions   Allergies as of 11/25/2022       Reactions   Sulfa Antibiotics Anaphylaxis   Codeine Itching   Penicillins Other (See Comments)   Internal  bleeding, welts         Medication List     STOP taking these medications    oseltamivir 75 MG capsule Commonly known as: TAMIFLU       TAKE these medications    acetaminophen 325 MG tablet Commonly known as: TYLENOL Take 650 mg by mouth as needed for moderate pain.   amLODipine 10 MG tablet Commonly known as: NORVASC Take 10 mg by mouth daily.   atenolol 25 MG tablet Commonly known as: TENORMIN Take 25 mg by mouth at bedtime.   Breztri Aerosphere 160-9-4.8 MCG/ACT Aero Generic drug: Budeson-Glycopyrrol-Formoterol Inhale 2 puffs into the lungs 2 (two) times daily.   busPIRone 5 MG tablet Commonly known as: BUSPAR Take 5 mg by mouth 2 (two) times daily.   cefdinir 300 MG capsule Commonly known as: OMNICEF Take 1 capsule (300 mg total) by mouth 2 (two) times daily for 5 days.   citalopram 10 MG tablet Commonly known as: CELEXA Take 10 mg by mouth daily.   predniSONE 50 MG tablet Commonly known as: DELTASONE Take 1 tablet (50 mg total) by mouth daily with breakfast for 4 days. Start taking on: November 26, 2022   Ventolin HFA 108 (90 Base) MCG/ACT  inhaler Generic drug: albuterol Inhale 2 puffs into the lungs 3 (three) times daily as needed for wheezing or shortness of breath.               Durable Medical Equipment  (From admission, onward)           Start     Ordered   11/25/22 1033  For home use only DME Walker rolling  Once       Question Answer Comment  Walker: With [place] Wheels   Patient needs a walker to treat with the following condition Balance disorder      11/25/22 1032   11/25/22 1033  For home use only DME Bedside commode  Once       Question:  Patient needs a bedside commode to treat with the following condition  Answer:  Balance problem   11/25/22 1032            Follow-up Information     Rhea Bleacher, NP Follow up in 1 week(s).   Contact information: [address] [place] [address] [phone]                Allergies  Allergen Reactions   Sulfa Antibiotics Anaphylaxis   Codeine Itching   Penicillins Other (See Comments)    Internal bleeding, welts     Consultations: None   Procedures/Studies: CT Angio Chest PE W and/or Wo Contrast  Result Date: 11/23/2022 CLINICAL DATA:  High probability for PE. Abdominal pain. Elevated LFTs. EXAM: CT ANGIOGRAPHY CHEST CT ABDOMEN AND PELVIS WITH CONTRAST TECHNIQUE: Multidetector CT imaging of the chest was performed using the standard protocol during bolus administration of intravenous contrast. Multiplanar CT image reconstructions and MIPs were obtained to evaluate the vascular anatomy. Multidetector CT imaging of the abdomen and pelvis was performed using the standard protocol during bolus administration of intravenous contrast. RADIATION DOSE REDUCTION: This exam was performed according to the departmental dose-optimization program which includes automated exposure control, adjustment of the mA and/or kV according to patient size and/or use of iterative reconstruction technique. CONTRAST:  92m OMNIPAQUE IOHEXOL 350 MG/ML SOLN  COMPARISON:  CT chest 08/06/2022 FINDINGS: CTA CHEST FINDINGS Cardiovascular: Satisfactory opacification of the pulmonary arteries to the segmental level. No  evidence of pulmonary embolism. Normal heart size. No pericardial effusion. Mediastinum/Nodes: There is a mildly enlarged subcarinal lymph node measuring cm, new from prior. There are mildly enlarged bilateral hilar lymph nodes measuring up to 1 cm. Visualized esophagus and thyroid gland are within normal limits. Lungs/Pleura: Emphysematous changes are again seen and similar to prior. There is some central peribronchial wall thickening bilaterally. There is a small amount of patchy airspace disease in the left lung base/left lower lobe and within the lingula, new from prior. There is some scattered tree-in-bud opacities in the left lower lobe, new from prior. Calcified granuloma in the left upper lobe appears stable. Musculoskeletal: No chest wall abnormality. No acute or significant osseous findings. Review of the MIP images confirms the above findings. CT ABDOMEN and PELVIS FINDINGS Hepatobiliary: No focal liver abnormality is seen. Status post cholecystectomy. No biliary dilatation. Pancreas: Unremarkable. No pancreatic ductal dilatation or surrounding inflammatory changes. Spleen: Normal in size without focal abnormality. Adrenals/Urinary Tract: Adrenal glands are unremarkable. Kidneys are normal, without renal calculi, focal lesion, or hydronephrosis. Bladder is unremarkable. Stomach/Bowel: Stomach is within normal limits. Appendix appears normal. No evidence of bowel wall thickening, distention, or inflammatory changes. Vascular/Lymphatic: Aortic atherosclerosis. No enlarged abdominal or pelvic lymph nodes. Reproductive: Uterus and bilateral adnexa are unremarkable. Other: No abdominal wall hernia or abnormality. No abdominopelvic ascites. Musculoskeletal: Degenerative changes affect the spine. There is levoconvex curvature of the lumbar spine. Review of  the MIP images confirms the above findings. IMPRESSION: 1. No evidence for pulmonary embolism. 2. New patchy and tree-in-bud opacities in the left lower lobe and lingula compatible with infection/inflammation. 3. New mild mediastinal and hilar lymphadenopathy, likely reactive. 4. No acute localizing process in the abdomen or pelvis. Aortic Atherosclerosis (ICD10-I70.0) and Emphysema (ICD10-J43.9). Electronically Signed   By: Ronney Asters M.D.   On: 11/23/2022 20:34   CT ABDOMEN PELVIS W CONTRAST  Result Date: 11/23/2022 CLINICAL DATA:  High probability for PE. Abdominal pain. Elevated LFTs. EXAM: CT ANGIOGRAPHY CHEST CT ABDOMEN AND PELVIS WITH CONTRAST TECHNIQUE: Multidetector CT imaging of the chest was performed using the standard protocol during bolus administration of intravenous contrast. Multiplanar CT image reconstructions and MIPs were obtained to evaluate the vascular anatomy. Multidetector CT imaging of the abdomen and pelvis was performed using the standard protocol during bolus administration of intravenous contrast. RADIATION DOSE REDUCTION: This exam was performed according to the departmental dose-optimization program which includes automated exposure control, adjustment of the mA and/or kV according to patient size and/or use of iterative reconstruction technique. CONTRAST:  61m OMNIPAQUE IOHEXOL 350 MG/ML SOLN COMPARISON:  CT chest 08/06/2022 FINDINGS: CTA CHEST FINDINGS Cardiovascular: Satisfactory opacification of the pulmonary arteries to the segmental level. No evidence of pulmonary embolism. Normal heart size. No pericardial effusion. Mediastinum/Nodes: There is a mildly enlarged subcarinal lymph node measuring cm, new from prior. There are mildly enlarged bilateral hilar lymph nodes measuring up to 1 cm. Visualized esophagus and thyroid gland are within normal limits. Lungs/Pleura: Emphysematous changes are again seen and similar to prior. There is some central peribronchial wall  thickening bilaterally. There is a small amount of patchy airspace disease in the left lung base/left lower lobe and within the lingula, new from prior. There is some scattered tree-in-bud opacities in the left lower lobe, new from prior. Calcified granuloma in the left upper lobe appears stable. Musculoskeletal: No chest wall abnormality. No acute or significant osseous findings. Review of the MIP images confirms the above findings. CT ABDOMEN and PELVIS FINDINGS Hepatobiliary:  No focal liver abnormality is seen. Status post cholecystectomy. No biliary dilatation. Pancreas: Unremarkable. No pancreatic ductal dilatation or surrounding inflammatory changes. Spleen: Normal in size without focal abnormality. Adrenals/Urinary Tract: Adrenal glands are unremarkable. Kidneys are normal, without renal calculi, focal lesion, or hydronephrosis. Bladder is unremarkable. Stomach/Bowel: Stomach is within normal limits. Appendix appears normal. No evidence of bowel wall thickening, distention, or inflammatory changes. Vascular/Lymphatic: Aortic atherosclerosis. No enlarged abdominal or pelvic lymph nodes. Reproductive: Uterus and bilateral adnexa are unremarkable. Other: No abdominal wall hernia or abnormality. No abdominopelvic ascites. Musculoskeletal: Degenerative changes affect the spine. There is levoconvex curvature of the lumbar spine. Review of the MIP images confirms the above findings. IMPRESSION: 1. No evidence for pulmonary embolism. 2. New patchy and tree-in-bud opacities in the left lower lobe and lingula compatible with infection/inflammation. 3. New mild mediastinal and hilar lymphadenopathy, likely reactive. 4. No acute localizing process in the abdomen or pelvis. Aortic Atherosclerosis (ICD10-I70.0) and Emphysema (ICD10-J43.9). Electronically Signed   By: Ronney Asters M.D.   On: 11/23/2022 20:34   DG Chest 2 View  Result Date: 11/23/2022 CLINICAL DATA:  Worsening shortness of breath.  Chest pain. EXAM:  CHEST - 2 VIEW COMPARISON:  Chest radiographs 07/14/2021 and 05/22/2021; CT abdomen and pelvis 08/06/2022 FINDINGS: Cardiac silhouette and mediastinal contours are within normal limits. There is flattening of the diaphragms and moderate hyperinflation there is increased patchy airspace opacities within the left greater than right mid and lower lungs, appearing to be greatest within the lingula. No pleural effusion or pneumothorax. Mild-to-moderate multilevel degenerative disc changes of the thoracic spine. IMPRESSION: 1. Increased patchy airspace opacities within the left greater than right mid and lower lungs, appearing to be greatest within the lingula. This is suspicious for multifocal pneumonia. Asymmetric edema can have a similar appearance. 2. COPD. Electronically Signed   By: Yvonne Kendall M.D.   On: 11/23/2022 16:36     Discharge Exam: Vitals:   11/25/22 0742 11/25/22 0745  BP: 136/82   Pulse: 95 98  Resp: 16 18  Temp: 98 F (36.7 C)   SpO2: 97% 96%   Vitals:   11/24/22 1945 11/25/22 0409 11/25/22 0742 11/25/22 0745  BP: 122/71 (!) 151/79 136/82   Pulse: 88 70 95 98  Resp:   16 18  Temp: (!) 97.5 F (36.4 C) 99.1 F (37.3 C) 98 F (36.7 C)   TempSrc: Oral Oral Oral   SpO2: 94% 96% 97% 96%  Weight:      Height:        General: Pt is alert, awake, not in acute distress Cardiovascular: RRR, S1/S2 +, no rubs, no gallops Respiratory: Mild end expiratory wheezes bilaterally. Abdominal: Soft, NT, ND, bowel sounds + Extremities: no edema, no cyanosis    The results of significant diagnostics from this hospitalization (including imaging, microbiology, ancillary and laboratory) are listed below for reference.     Microbiology: Recent Results (from the past 240 hour(s))  Blood culture (routine x 2)     Status: None (Preliminary result)   Collection Time: 11/23/22  7:32 PM   Specimen: BLOOD LEFT HAND  Result Value Ref Range Status   Specimen Description BLOOD LEFT HAND  Final    Special Requests   Final    BOTTLES DRAWN AEROBIC AND ANAEROBIC Blood Culture adequate volume   Culture   Final    NO GROWTH < 12 HOURS Performed at [facility], [place] [address] [place], [place] [address]    Report Status  PENDING  Incomplete  Blood culture (routine x 2)     Status: None (Preliminary result)   Collection Time: 11/23/22  7:35 PM   Specimen: BLOOD  Result Value Ref Range Status   Specimen Description BLOOD RIGHT ANTECUBITAL  Final   Special Requests   Final    BOTTLES DRAWN AEROBIC AND ANAEROBIC Blood Culture results may not be optimal due to an excessive volume of blood received in culture bottles   Culture   Final    NO GROWTH < 12 HOURS Performed at [facility], [place] [address] [place], [place] [address]    Report Status PENDING  Incomplete  Resp panel by RT-PCR (RSV, Flu A&B, Covid) Anterior Nasal Swab     Status: None   Collection Time: 11/23/22  8:49 PM   Specimen: Anterior Nasal Swab  Result Value Ref Range Status   SARS Coronavirus 2 by RT PCR NEGATIVE NEGATIVE Final   Influenza A by PCR NEGATIVE NEGATIVE Final   Influenza B by PCR NEGATIVE NEGATIVE Final    Comment: (NOTE) The Xpert Xpress SARS-CoV-2/FLU/RSV plus assay is intended as an aid in the diagnosis of influenza from Nasopharyngeal swab specimens and should not be used as a sole basis for treatment. Nasal washings and aspirates are unacceptable for Xpert Xpress SARS-CoV-2/FLU/RSV testing.  Fact Sheet for Patients: EntrepreneurPulse.com.au  Fact Sheet for Healthcare Providers: IncredibleEmployment.be  This test is not yet approved or cleared by the [place] FDA and has been authorized for detection and/or diagnosis of SARS-CoV-2 by FDA under an Emergency Use Authorization (EUA). This EUA will remain in effect (meaning this test can be used) for the duration of the COVID-19 declaration under Section 564(b)(1) of the Act, 21 U.S.C. section  360bbb-3(b)(1), unless the authorization is terminated or revoked.     Resp Syncytial Virus by PCR NEGATIVE NEGATIVE Final    Comment: (NOTE) Fact Sheet for Patients: EntrepreneurPulse.com.au  Fact Sheet for Healthcare Providers: IncredibleEmployment.be  This test is not yet approved or cleared by the [place] FDA and has been authorized for detection and/or diagnosis of SARS-CoV-2 by FDA under an Emergency Use Authorization (EUA). This EUA will remain in effect (meaning this test can be used) for the duration of the COVID-19 declaration under Section 564(b)(1) of the Act, 21 U.S.C. section 360bbb-3(b)(1), unless the authorization is terminated or revoked.  Performed at [facility], [place] [address], [place], [place] [address]   MRSA Next Gen by PCR, Nasal     Status: None   Collection Time: 11/24/22 12:15 AM  Result Value Ref Range Status   MRSA by PCR Next Gen NOT DETECTED NOT DETECTED Final    Comment: (NOTE) The GeneXpert MRSA Assay (FDA approved for NASAL specimens only), is one component of a comprehensive MRSA colonization surveillance program. It is not intended to diagnose MRSA infection nor to guide or monitor treatment for MRSA infections. Test performance is not FDA approved in patients less than 27 years old. Performed at [facility], [place] [address] [place], [place], [place] [address]      Labs: BNP (last 3 results) Recent Labs    11/23/22 1718  BNP 123456   Basic Metabolic Panel: Recent Labs  Lab 11/23/22 1713 11/23/22 1810 11/24/22 0256 11/25/22 0853  NA 139 136 140 143  K 3.5 3.3* 3.3* 3.9  CL 94*  --  96* 100  CO2 29  --  32 33*  GLUCOSE 126*  --  189* 100*  BUN 16  --  16 19  CREATININE 0.77  --  0.69 0.66  CALCIUM 9.4  --  9.2 9.4   Liver Function Tests: Recent Labs  Lab 11/23/22 1713 11/24/22 0256 11/25/22 0853  AST 140* 83* 66*  ALT 104* 84* 83*  ALKPHOS 264* 226* 231*  BILITOT 1.0 0.5  0.5  PROT 6.6 5.8* 5.9*  ALBUMIN 2.9* 2.5* 2.5*   No results for input(s): "LIPASE", "AMYLASE" in the last 168 hours. No results for input(s): "AMMONIA" in the last 168 hours. CBC: Recent Labs  Lab 11/23/22 1713 11/23/22 1810 11/24/22 0256 11/25/22 0853  WBC 17.4*  --  16.5* 20.4*  NEUTROABS 15.3*  --   --  16.3*  HGB 11.9* 12.2 10.3* 11.0*  HCT 37.6 36.0 31.7* 35.1*  MCV 93.1  --  90.3 93.4  PLT 240  --  204 292   Cardiac Enzymes: No results for input(s): "CKTOTAL", "CKMB", "CKMBINDEX", "TROPONINI" in the last 168 hours. BNP: Invalid input(s): "POCBNP" CBG: No results for input(s): "GLUCAP" in the last 168 hours. D-Dimer No results for input(s): "DDIMER" in the last 72 hours. Hgb A1c No results for input(s): "HGBA1C" in the last 72 hours. Lipid Profile No results for input(s): "CHOL", "HDL", "LDLCALC", "TRIG", "CHOLHDL", "LDLDIRECT" in the last 72 hours. Thyroid function studies No results for input(s): "TSH", "T4TOTAL", "T3FREE", "THYROIDAB" in the last 72 hours.  Invalid input(s): "FREET3" Anemia work up No results for input(s): "VITAMINB12", "FOLATE", "FERRITIN", "TIBC", "IRON", "RETICCTPCT" in the last 72 hours. Urinalysis No results found for: "COLORURINE", "APPEARANCEUR", "LABSPEC", "PHURINE", "GLUCOSEU", "HGBUR", "BILIRUBINUR", "KETONESUR", "PROTEINUR", "UROBILINOGEN", "NITRITE", "LEUKOCYTESUR" Sepsis Labs Recent Labs  Lab 11/23/22 1713 11/24/22 0256 11/25/22 0853  WBC 17.4* 16.5* 20.4*   Microbiology Recent Results (from the past 240 hour(s))  Blood culture (routine x 2)     Status: None (Preliminary result)   Collection Time: 11/23/22  7:32 PM   Specimen: BLOOD LEFT HAND  Result Value Ref Range Status   Specimen Description BLOOD LEFT HAND  Final   Special Requests   Final    BOTTLES DRAWN AEROBIC AND ANAEROBIC Blood Culture adequate volume   Culture   Final    NO GROWTH < 12 HOURS Performed at [facility], [place] [address], [place],  [place] [address]    Report Status PENDING  Incomplete  Blood culture (routine x 2)     Status: None (Preliminary result)   Collection Time: 11/23/22  7:35 PM   Specimen: BLOOD  Result Value Ref Range Status   Specimen Description BLOOD RIGHT ANTECUBITAL  Final   Special Requests   Final    BOTTLES DRAWN AEROBIC AND ANAEROBIC Blood Culture results may not be optimal due to an excessive volume of blood received in culture bottles   Culture   Final    NO GROWTH < 12 HOURS Performed at [facility], [place] [address] [place], [place] [address]    Report Status PENDING  Incomplete  Resp panel by RT-PCR (RSV, Flu A&B, Covid) Anterior Nasal Swab     Status: None   Collection Time: 11/23/22  8:49 PM   Specimen: Anterior Nasal Swab  Result Value Ref Range Status   SARS Coronavirus 2 by RT PCR NEGATIVE NEGATIVE Final   Influenza A by PCR NEGATIVE NEGATIVE Final   Influenza B by PCR NEGATIVE NEGATIVE Final    Comment: (NOTE) The Xpert Xpress SARS-CoV-2/FLU/RSV plus assay is intended as an aid in the diagnosis of influenza  from Nasopharyngeal swab specimens and should not be used as a sole basis for treatment. Nasal washings and aspirates are unacceptable for Xpert Xpress SARS-CoV-2/FLU/RSV testing.  Fact Sheet for Patients: EntrepreneurPulse.com.au  Fact Sheet for Healthcare Providers: IncredibleEmployment.be  This test is not yet approved or cleared by the [place] FDA and has been authorized for detection and/or diagnosis of SARS-CoV-2 by FDA under an Emergency Use Authorization (EUA). This EUA will remain in effect (meaning this test can be used) for the duration of the COVID-19 declaration under Section 564(b)(1) of the Act, 21 U.S.C. section 360bbb-3(b)(1), unless the authorization is terminated or revoked.     Resp Syncytial Virus by PCR NEGATIVE NEGATIVE Final    Comment: (NOTE) Fact Sheet for  Patients: EntrepreneurPulse.com.au  Fact Sheet for Healthcare Providers: IncredibleEmployment.be  This test is not yet approved or cleared by the [place] FDA and has been authorized for detection and/or diagnosis of SARS-CoV-2 by FDA under an Emergency Use Authorization (EUA). This EUA will remain in effect (meaning this test can be used) for the duration of the COVID-19 declaration under Section 564(b)(1) of the Act, 21 U.S.C. section 360bbb-3(b)(1), unless the authorization is terminated or revoked.  Performed at [facility], [place] [address], [place], [place] [address]   MRSA Next Gen by PCR, Nasal     Status: None   Collection Time: 11/24/22 12:15 AM  Result Value Ref Range Status   MRSA by PCR Next Gen NOT DETECTED NOT DETECTED Final    Comment: (NOTE) The GeneXpert MRSA Assay (FDA approved for NASAL specimens only), is one component of a comprehensive MRSA colonization surveillance program. It is not intended to diagnose MRSA infection nor to guide or monitor treatment for MRSA infections. Test performance is not FDA approved in patients less than 69 years old. Performed at [facility], [place] [address] [place], [place] [address]      Time coordinating discharge: Over 30 minutes  SIGNED:   Darliss Cheney, MD  [facility] 11/25/2022, 10:34 AM *Please note that this is a verbal dictation therefore any spelling or grammatical errors are due to the "[place]" system interpretation. If 7PM-7AM, please contact night-coverage [email or web address]

## 2022-11-25 NOTE — Evaluation (Signed)
Physical Therapy Evaluation Patient Details Name: Jodi Curtis MRN: [id number] DOB: July 17, 1956 Today's Date: 11/25/2022  History of Present Illness  67 y.o. female presents to [facility] hospital on 11/23/2022 with increasing SOB. Pt admitted for management of COPD exacerbation. PMH includes COPD, anxiety, HTN. (Simultaneous filing. User may not have seen previous data.)  Clinical Impression  Pt presents to PT with deficits in strength, power, balance, and endurance, however does not seem far from her self-reported baseline. Pt is able to transfer without DME and ambulate to bathroom without UE support. PT provides education on use of rollator in an effort to improve energy conservation when mobilizing in the home and community. Pt will benefit from receiving a rollator and BSC in addition to HHPT at the time of discharge. Acute PT will follow until discharge is complete.       Recommendations for follow up therapy are one component of a multi-disciplinary discharge planning process, led by the attending physician.  Recommendations may be updated based on patient status, additional functional criteria and insurance authorization.  Follow Up Recommendations Home health PT      Assistance Recommended at Discharge PRN  Patient can return home with the following  A little help with bathing/dressing/bathroom;Assistance with cooking/housework;Assist for transportation;Help with stairs or ramp for entrance    Equipment Recommendations Rollator (4 wheels);BSC/3in1  Recommendations for Other Services       Functional Status Assessment Patient has had a recent decline in their functional status and demonstrates the ability to make significant improvements in function in a reasonable and predictable amount of time.     Precautions / Restrictions Precautions Precautions: Fall (Simultaneous filing. User may not have seen previous data.) Precaution Comments: chronic 3-4L O2 (Simultaneous filing. User may not  have seen previous data.) Restrictions Weight Bearing Restrictions: No (Simultaneous filing. User may not have seen previous data.)      Mobility  Bed Mobility Overal bed mobility: Modified Independent             General bed mobility comments: HOB elevated    Transfers Overall transfer level: Modified independent Equipment used: Rollator (4 wheels)               General transfer comment: also stands without DME    Ambulation/Gait Ambulation/Gait assistance: Modified independent (Device/Increase time) Gait Distance (Feet): 80 Feet (one standing rest break) Assistive device: Rollator (4 wheels) Gait Pattern/deviations: Step-through pattern Gait velocity: reduced Gait velocity interpretation: <1.8 ft/sec, indicate of risk for recurrent falls   General Gait Details: slowed step-through gait, PT provides demonstration and cues for brake use  Stairs            Wheelchair Mobility    Modified Rankin (Stroke Patients Only)       Balance Overall balance assessment: Needs assistance Sitting-balance support: No upper extremity supported, Feet supported Sitting balance-Leahy Scale: Good     Standing balance support: No upper extremity supported, During functional activity Standing balance-Leahy Scale: Good                               Pertinent Vitals/Pain Pain Assessment Pain Assessment: No/denies pain (Simultaneous filing. User may not have seen previous data.)    Home Living Family/patient expects to be discharged to:: Private residence (Simultaneous filing. User may not have seen previous data.) Living Arrangements: Alone (Simultaneous filing. User may not have seen previous data.) Available Help at Discharge: Family;Available PRN/intermittently (Simultaneous filing. User may  not have seen previous data.) Type of Home: Mobile home (Simultaneous filing. User may not have seen previous data.) Home Access: Stairs to enter Entrance  Stairs-Rails: Can reach both Entrance Stairs-Number of Steps: 3   Home Layout: One level (Simultaneous filing. User may not have seen previous data.) Home Equipment: Other (comment) (oxygen  Simultaneous filing. User may not have seen previous data.) Additional Comments: daughter assists as needed    Prior Function Prior Level of Function : Independent/Modified Independent (Simultaneous filing. User may not have seen previous data.)             Mobility Comments: no AD (Simultaneous filing. User may not have seen previous data.) ADLs Comments: doesnt drive (Simultaneous filing. User may not have seen previous data.)     Hand Dominance        Extremity/Trunk Assessment   Upper Extremity Assessment Upper Extremity Assessment: Defer to OT evaluation (Simultaneous filing. User may not have seen previous data.)    Lower Extremity Assessment Lower Extremity Assessment: Generalized weakness (Simultaneous filing. User may not have seen previous data.)    Cervical / Trunk Assessment Cervical / Trunk Assessment: Kyphotic (Simultaneous filing. User may not have seen previous data.)  Communication   Communication: No difficulties (Simultaneous filing. User may not have seen previous data.)  Cognition Arousal/Alertness: Awake/alert (Simultaneous filing. User may not have seen previous data.) Behavior During Therapy: [facility] for tasks assessed/performed (Simultaneous filing. User may not have seen previous data.) Overall Cognitive Status: Within Functional Limits for tasks assessed (Simultaneous filing. User may not have seen previous data.)                                          General Comments General comments (skin integrity, edema, etc.): pt on 4L [place], sats from 87-90% when mobilizing this session, pt reports this is typical for her    Exercises     Assessment/Plan    PT Assessment Patient needs continued PT services  PT Problem List Decreased  strength;Decreased activity tolerance;Decreased balance;Decreased mobility;Cardiopulmonary status limiting activity;Decreased knowledge of use of DME       PT Treatment Interventions DME instruction;Gait training;Stair training;Functional mobility training;Therapeutic activities;Balance training;Neuromuscular re-education;Patient/family education    PT Goals (Current goals can be found in the Care Plan section)  Acute Rehab PT Goals Patient Stated Goal: to return to independence PT Goal Formulation: With patient Time For Goal Achievement: 12/09/22 Potential to Achieve Goals: Good    Frequency Min 3X/week     Co-evaluation PT/OT/SLP Co-Evaluation/Treatment: Yes Reason for Co-Treatment: For patient/therapist safety;To address functional/ADL transfers PT goals addressed during session: Mobility/safety with mobility;Balance;Proper use of DME         AM-PAC PT "6 Clicks" Mobility  Outcome Measure Help needed turning from your back to your side while in a flat bed without using bedrails?: None Help needed moving from lying on your back to sitting on the side of a flat bed without using bedrails?: None Help needed moving to and from a bed to a chair (including a wheelchair)?: None Help needed standing up from a chair using your arms (e.g., wheelchair or bedside chair)?: None Help needed to walk in hospital room?: None Help needed climbing 3-5 steps with a railing? : A Little 6 Click Score: 23    End of Session Equipment Utilized During Treatment: Oxygen Activity Tolerance: Patient tolerated treatment well Patient left: in chair;with  call bell/phone within reach Nurse Communication: Mobility status PT Visit Diagnosis: Other abnormalities of gait and mobility (R26.89);Muscle weakness (generalized) (M62.81)    Time: [id number] PT Time Calculation (min) (ACUTE ONLY): 36 min   Charges:   PT Evaluation $PT Eval Low Complexity: [place], PT, DPT Acute  Rehabilitation Office [phone]   Zenaida Niece 11/25/2022, 10:34 AM

## 2022-11-25 NOTE — Progress Notes (Signed)
TOC meds were delivered to pt's room at approx 1216.  BSC/3 IN 1 has been delivered to pt's room by DME company, awaiting "rollator" (4 wheeled rolling walker with seat) to be delivered to pt's room. Pt also has [facility] referral to come to pt's bedside before pt will be ready for discharge.

## 2022-11-25 NOTE — Progress Notes (Signed)
Patient ambulated on 4l [place], spo2 remained above 90%

## 2022-11-25 NOTE — TOC Initial Note (Signed)
Transition of Care [facility]) - Initial/Assessment Note    Patient Details  Name: Jodi Curtis MRN: [id number] Date of Birth: Oct 12, 1955  [facility] CM/SW Contact:    Curlene Labrum, RN Phone Number: 11/25/2022, 11:45 AM  Clinical Narrative:                 CM met with the patient and daughter at the bedside to discuss TOC needs.  The patient's daughter was agreeable to have the patient discharge to her home this week until she is feeling better to return home alone.  I discussed with the daughter Medicare choice for home health company and she did not have a preference.  I called Lucile Shutters and Tommi Rumps, CM accepted her for home health services.  The patient will be discharging to the daughter's home at [facility], [place], [place].  The patient's daughter will call [employer] to have concentrator placed at her home.  The patient has chronic oxygen needs through them.  The daughter said she was not happy with their services - so I placed a referral with [facility] and she will visit with the daughter to have services switched over at a later date.  [employer] - I called to have 3:1 delivered to the hospital room.  The daughter states that she has a RW at her home and does not need the RW requested by PT/OT.  The patient's daughter will provide transportation to home via car once her DME is delivered and [facility] visits with the patient within the hour.  Expected Discharge Plan: [place] Barriers to Discharge: No Barriers Identified   Patient Goals and CMS Choice Patient states their goals for this hospitalization and ongoing recovery are:: To get better and return home CMS Medicare.gov Compare Post Acute Care list provided to:: Patient Choice offered to / list presented to : Patient      Expected Discharge Plan and Services   Discharge Planning Services: CM Consult Post Acute Care Choice: [place] arrangements for the past 2 months: Single Family  Home Expected Discharge Date: 11/25/22               DME Arranged: Gilford Rile rolling with seat, 3-N-1 DME Agency: [employer] Date DME Agency Contacted: 11/25/22 Time DME Agency Contacted: [phone] Representative spoke with at DME Agency: Clarisse Gouge, [place] with [employer] for [place] and 3:1 HH Arranged: RN, PT, OT, Nurse's Aide [place] Agency: [place] Date [facility] Agency Contacted: 11/25/22 Time [place]: [place] Representative spoke with at [place]: Tommi Rumps, [place] with [facility] HH  Prior Living Arrangements/Services Living arrangements for the past 2 months: [place] with:: Self Patient language and need for interpreter reviewed:: Yes Do you feel safe going back to the place where you live?: Yes      Need for Family Participation in Patient Care: Yes (Comment) Care giver support system in place?: Yes (comment) Current home services: DME (Home oxygen thru [employer], [employer]) Criminal Activity/Legal Involvement Pertinent to Current Situation/Hospitalization: No - Comment as needed  Activities of Daily Living Home Assistive Devices/Equipment: Eyeglasses ADL Screening (condition at time of admission) Patient's cognitive ability adequate to safely complete daily activities?: Yes Is the patient deaf or have difficulty hearing?: No Does the patient have difficulty seeing, even when wearing glasses/contacts?: Yes Does the patient have difficulty concentrating, remembering, or making decisions?: No Patient able to express need for assistance with ADLs?: No Does the patient have difficulty dressing  or bathing?: No Independently performs ADLs?: Yes (appropriate for developmental age) Does the patient have difficulty walking or climbing stairs?: No Weakness of Legs: None Weakness of Arms/Hands: None  Permission Sought/Granted Permission sought to share information with : Case Manager, Family Supports, Chartered certified accountant granted to share  information with : Yes, Verbal Permission Granted     Permission granted to share info w AGENCY: [place] agency, DME company for 3:1 and Rolator, [facility] contact to switch services for oxygen company  Permission granted to share info w Relationship: daughter at bedside     Emotional Assessment Appearance:: Appears stated age Attitude/Demeanor/Rapport: Gracious Affect (typically observed): Accepting Orientation: : Oriented to Self, Oriented to Place, Oriented to  Time, Oriented to Situation Alcohol / Substance Use: Not Applicable Psych Involvement: No (comment)  Admission diagnosis:  Acute respiratory failure ([place]) [J96.00] Chronic obstructive pulmonary disease, unspecified COPD type ([place]) [J44.9] Community acquired pneumonia, unspecified laterality [J18.9] Patient Active Problem List   Diagnosis Date Noted   CAP (community acquired pneumonia) 11/24/2022   HTN (hypertension) 11/24/2022   Anxiety 11/24/2022   Acute hepatitis 11/24/2022   Acute respiratory failure ([place]) 11/23/2022   Former smoker 03/11/2020   COPD with acute exacerbation ([place]) 03/14/2019   Exercise hypoxemia 03/14/2019   PCP:  Rhea Bleacher, NP Pharmacy:   [facility], [place] [address] Phone: [phone] Fax: [facility], [place] [address] Phone: [phone] Fax: [facility] [address] [place] [address] Phone: [phone] Fax: [phone]     Social Determinants of Health (SDOH) Social History: SDOH Screenings   Food Insecurity: No Food Insecurity (11/23/2022)  Housing: Low Risk  (11/23/2022)  Transportation Needs: No Transportation Needs (11/23/2022)  Utilities: Not At Risk (11/23/2022)  Tobacco Use: Medium Risk (11/24/2022)   SDOH Interventions:     Readmission Risk Interventions    11/25/2022    11:33 AM  Readmission Risk Prevention Plan  Post Dischage Appt Complete  Medication Screening Complete  Transportation Screening Complete

## 2022-11-25 NOTE — Evaluation (Addendum)
Occupational Therapy Evaluation Patient Details Name: Jodi Curtis MRN: [id number] DOB: Jan 19, 1956 Today's Date: 11/25/2022   History of Present Illness 67 y.o. female presents to [facility] hospital on 11/23/2022 with increasing SOB. Pt admitted for management of COPD exacerbation. PMH includes COPD, anxiety, HTN. (Simultaneous filing. User may not have seen previous data.)   Clinical Impression   Jodi Curtis was evaluated s/p the above admission list. She is generally indep at baseline with assist from daughter for IADLs as needed. Upon evaluation she was limited by SOB, activity tolerance and generalized weakness. Overall she was generalized supervision A for mobility and ADLs with minimal cues for rollator management. Educated on energy conservation throughout with good recall. Pt will benefit from continued acute OT services. Recommend d/c to home with no follow up OT.  Pt on baseline 4L [place] throughout session with SpO2 88-92% which she reports is her baseline, no over SOB noted.        Recommendations for follow up therapy are one component of a multi-disciplinary discharge planning process, led by the attending physician.  Recommendations may be updated based on patient status, additional functional criteria and insurance authorization.   Follow Up Recommendations  No OT follow up     Assistance Recommended at Discharge PRN  Patient can return home with the following Assist for transportation;Assistance with cooking/housework    Functional Status Assessment  Patient has had a recent decline in their functional status and demonstrates the ability to make significant improvements in function in a reasonable and predictable amount of time.   Equipment Recommendations  BSC/3in1;Other (comment) (rollator)       Precautions / Restrictions Precautions Precautions: Fall (Simultaneous filing. User may not have seen previous data.) Precaution Comments: chronic 3-4L O2 (Simultaneous filing. User may not  have seen previous data.) Restrictions Weight Bearing Restrictions: No (Simultaneous filing. User may not have seen previous data.)      Mobility Bed Mobility Overal bed mobility: Needs Assistance (Simultaneous filing. User may not have seen previous data.)             General bed mobility comments: OOB upon arrival (Simultaneous filing. User may not have seen previous data.)    Transfers Overall transfer level: Needs assistance (Simultaneous filing. User may not have seen previous data.) Equipment used: Rollator (4 wheels) (Simultaneous filing. User may not have seen previous data.) Transfers: Sit to/from Stand Sit to Stand: Supervision           General transfer comment: cues for brakes (Simultaneous filing. User may not have seen previous data.)          ADL either performed or assessed with clinical judgement   ADL Overall ADL's : Needs assistance/impaired Eating/Feeding: Independent   Grooming: Supervision/safety;Standing   Upper Body Bathing: Set up;Sitting   Lower Body Bathing: Supervison/ safety;Sit to/from stand   Upper Body Dressing : Independent   Lower Body Dressing: Supervision/safety;Sit to/from stand   Toilet Transfer: Supervision/safety;Ambulation   Toileting- Clothing Manipulation and Hygiene: Supervision/safety;Sitting/lateral lean       Functional mobility during ADLs: Supervision/safety General ADL Comments: energy conservation strategies discussed throughout. rollator used with good management of breaks     Vision Baseline Vision/History: 0 No visual deficits Vision Assessment?: No apparent visual deficits     Perception Perception Perception Tested?: No   Praxis Praxis Praxis tested?: Not tested    Pertinent Vitals/Pain Pain Assessment Faces Pain Scale: Hurts a little bit Pain Location: generalized with movement Pain Descriptors / Indicators: Grimacing Pain Intervention(s): Monitored  during session, Limited activity within  patient's tolerance     Hand Dominance     Extremity/Trunk Assessment Upper Extremity Assessment Upper Extremity Assessment: Defer to OT evaluation (Simultaneous filing. User may not have seen previous data.)   Lower Extremity Assessment Lower Extremity Assessment: Generalized weakness (Simultaneous filing. User may not have seen previous data.)   Cervical / Trunk Assessment Cervical / Trunk Assessment: Kyphotic (Simultaneous filing. User may not have seen previous data.)   Communication Communication Communication: No difficulties (Simultaneous filing. User may not have seen previous data.)      General Comments  VSS on 4L, SpO2 88% briefly during toileting. 90% during mobility (Simultaneous filing. User may not have seen previous data.)    Exercises     Shoulder Instructions      Home Living Family/patient expects to be discharged to:: Private residence (Simultaneous filing. User may not have seen previous data.) Living Arrangements: Alone (Simultaneous filing. User may not have seen previous data.) Available Help at Discharge: Family;Available PRN/intermittently (Simultaneous filing. User may not have seen previous data.) Type of Home: Mobile home (Simultaneous filing. User may not have seen previous data.) Home Access: Stairs to enter Entrance Stairs-Number of Steps: 3 Entrance Stairs-Rails: Can reach both Home Layout: One level (Simultaneous filing. User may not have seen previous data.)     Bathroom Shower/Tub: Tub/shower unit;Walk-in shower (Simultaneous filing. User may not have seen previous data.)   Bathroom Toilet: Standard (Simultaneous filing. User may not have seen previous data.) Bathroom Accessibility: Yes (Simultaneous filing. User may not have seen previous data.) How Accessible: Accessible via walker Home Equipment: Other (comment) (oxygen  Simultaneous filing. User may not have seen previous data.)   Additional Comments: daughter assists as needed       Prior Functioning/Environment Prior Level of Function : Independent/Modified Independent (Simultaneous filing. User may not have seen previous data.)             Mobility Comments: no AD (Simultaneous filing. User may not have seen previous data.) ADLs Comments: doesnt drive (Simultaneous filing. User may not have seen previous data.)        OT Problem List: Decreased activity tolerance;Impaired balance (sitting and/or standing);Decreased knowledge of precautions;Cardiopulmonary status limiting activity      OT Treatment/Interventions: Self-care/ADL training;Therapeutic exercise;Energy conservation;DME and/or AE instruction;Patient/family education    OT Goals(Current goals can be found in the care plan section) Acute Rehab OT Goals Patient Stated Goal: home OT Goal Formulation: With patient Time For Goal Achievement: 12/09/22 Potential to Achieve Goals: Good ADL Goals Additional ADL Goal #1: Pt will complete BADLs with mod I  OT Frequency: Min 2X/week    Co-evaluation   Reason for Co-Treatment: For patient/therapist safety;To address functional/ADL transfers PT goals addressed during session: Mobility/safety with mobility;Balance;Proper use of DME        AM-PAC OT "6 Clicks" Daily Activity     Outcome Measure Help from another person eating meals?: None Help from another person taking care of personal grooming?: A Little Help from another person toileting, which includes using toliet, bedpan, or urinal?: A Little Help from another person bathing (including washing, rinsing, drying)?: A Little Help from another person to put on and taking off regular upper body clothing?: None Help from another person to put on and taking off regular lower body clothing?: A Little 6 Click Score: 20   End of Session Equipment Utilized During Treatment: Rollator (4 wheels);Oxygen (4L) Nurse Communication: Mobility status  Activity Tolerance: Patient tolerated treatment  well  Patient left: in chair;with call bell/phone within reach  OT Visit Diagnosis: Unsteadiness on feet (R26.81);Other abnormalities of gait and mobility (R26.89);Muscle weakness (generalized) (M62.81)                Time: [id number] OT Time Calculation (min): 19 min Charges:  OT General Charges $OT Visit: 1 Visit OT Evaluation $OT Eval Low Complexity: 1 Low  Shade Flood, OTR/L Acute Rehabilitation Services Office [place] Communication Preferred   Elliot Cousin 11/25/2022, 10:49 AM

## 2022-11-26 LAB — LEGIONELLA PNEUMOPHILA SEROGP 1 UR AG: L. pneumophila Serogp 1 Ur Ag: NEGATIVE

## 2022-11-28 LAB — CULTURE, BLOOD (ROUTINE X 2)
Culture: NO GROWTH
Culture: NO GROWTH
Special Requests: ADEQUATE

## 2022-12-29 DIAGNOSIS — Z20828 Contact with and (suspected) exposure to other viral communicable diseases: Secondary | ICD-10-CM | POA: Insufficient documentation

## 2023-01-21 DIAGNOSIS — J411 Mucopurulent chronic bronchitis: Secondary | ICD-10-CM | POA: Insufficient documentation

## 2023-03-04 ENCOUNTER — Other Ambulatory Visit (HOSPITAL_BASED_OUTPATIENT_CLINIC_OR_DEPARTMENT_OTHER): Payer: Self-pay

## 2023-09-09 DIAGNOSIS — B351 Tinea unguium: Secondary | ICD-10-CM | POA: Insufficient documentation

## 2023-09-09 DIAGNOSIS — D179 Benign lipomatous neoplasm, unspecified: Secondary | ICD-10-CM | POA: Insufficient documentation

## 2023-09-09 DIAGNOSIS — Z681 Body mass index (BMI) 19 or less, adult: Secondary | ICD-10-CM | POA: Insufficient documentation

## 2023-12-23 ENCOUNTER — Other Ambulatory Visit: Payer: Self-pay

## 2023-12-23 ENCOUNTER — Emergency Department (HOSPITAL_COMMUNITY): Admission: EM | Admit: 2023-12-23 | Discharge: 2023-12-23 | Disposition: A | Discharge status: Home / Self Care

## 2023-12-23 ENCOUNTER — Emergency Department (HOSPITAL_COMMUNITY)

## 2023-12-23 DIAGNOSIS — R0602 Shortness of breath: Secondary | ICD-10-CM | POA: Diagnosis present

## 2023-12-23 DIAGNOSIS — J441 Chronic obstructive pulmonary disease with (acute) exacerbation: Secondary | ICD-10-CM | POA: Insufficient documentation

## 2023-12-23 LAB — HEPATIC FUNCTION PANEL
ALT: 17 U/L (ref 0–44)
AST: 26 U/L (ref 15–41)
Albumin: 3.7 g/dL (ref 3.5–5.0)
Alkaline Phosphatase: 71 U/L (ref 38–126)
Bilirubin, Direct: 0.1 mg/dL (ref 0.0–0.2)
Indirect Bilirubin: 0.7 mg/dL (ref 0.3–0.9)
Total Bilirubin: 0.8 mg/dL (ref 0.0–1.2)
Total Protein: 6.7 g/dL (ref 6.5–8.1)

## 2023-12-23 LAB — CBC
HCT: 42.1 % (ref 36.0–46.0)
Hemoglobin: 13.2 g/dL (ref 12.0–15.0)
MCH: 29.1 pg (ref 26.0–34.0)
MCHC: 31.4 g/dL (ref 30.0–36.0)
MCV: 92.7 fL (ref 80.0–100.0)
Platelets: 210 10*3/uL (ref 150–400)
RBC: 4.54 MIL/uL (ref 3.87–5.11)
RDW: 12.6 % (ref 11.5–15.5)
WBC: 11.8 10*3/uL — ABNORMAL HIGH (ref 4.0–10.5)
nRBC: 0 % (ref 0.0–0.2)

## 2023-12-23 LAB — BASIC METABOLIC PANEL WITH GFR
Anion gap: 10 (ref 5–15)
BUN: 11 mg/dL (ref 8–23)
CO2: 32 mmol/L (ref 22–32)
Calcium: 9.5 mg/dL (ref 8.9–10.3)
Chloride: 99 mmol/L (ref 98–111)
Creatinine, Ser: 0.76 mg/dL (ref 0.44–1.00)
GFR, Estimated: >60 mL/min (ref >60)
Glucose, Bld: 80 mg/dL (ref 70–99)
Potassium: 4 mmol/L (ref 3.5–5.1)
Sodium: 141 mmol/L (ref 135–145)

## 2023-12-23 LAB — TROPONIN I (HIGH SENSITIVITY): Troponin I (High Sensitivity): 8 ng/L (ref <18)

## 2023-12-23 MED ORDER — ALBUTEROL SULFATE (2.5 MG/3ML) 0.083% IN NEBU
5.0000 mg | INHALATION_SOLUTION | Freq: Once | RESPIRATORY_TRACT | Status: AC
  Administered 2023-12-23: 5 mg via RESPIRATORY_TRACT
  Filled 2023-12-23: qty 6

## 2023-12-23 MED ORDER — PREDNISONE 10 MG PO TABS: 40.0000 mg | ORAL_TABLET | Freq: Every day | ORAL | 0 refills | Status: DC

## 2023-12-23 MED ORDER — PREDNISONE 10 MG PO TABS: 40.0000 mg | ORAL_TABLET | Freq: Every day | ORAL | 0 refills | Status: AC

## 2023-12-23 MED ORDER — IPRATROPIUM BROMIDE 0.02 % IN SOLN
0.5000 mg | Freq: Once | RESPIRATORY_TRACT | Status: AC
  Administered 2023-12-23: 0.5 mg via RESPIRATORY_TRACT
  Filled 2023-12-23: qty 2.5

## 2023-12-23 MED ORDER — PREDNISONE 20 MG PO TABS
60.0000 mg | ORAL_TABLET | Freq: Once | ORAL | Status: AC
  Administered 2023-12-23: 60 mg via ORAL
  Filled 2023-12-23: qty 3

## 2023-12-23 NOTE — ED Provider Notes (Addendum)
 [place] [facility]GENC[facility] DEPARTMENT AT [facility] Provider Note   CSN: [id number] Arrival date & time: 12/23/23  1122     History  Chief Complaint  Patient presents with   Shortness of Breath   right side pain    Jodi Curtis is a 68 y.o. female.  This is a 68 year old female history of COPD with chronic hypoxic respiratory failure on 2 L nasal cannula at baseline presents to the emergency department for worsening shortness of breath over the past 2 days.  Has increased cough.  Notes for the same duration that she has had, increased right upper quadrant/right lower chest pain.  Seemingly worsened with coughing and movement.  No associated nausea vomiting, last bowel movement was this morning and normal.  Reports history of prior cholecystectomy.  No fevers no chills.  No chest pain.   Shortness of Breath      Home Medications Prior to Admission medications   Medication Sig Start Date End Date Taking? Authorizing Provider  acetaminophen (TYLENOL) 325 MG tablet Take 650 mg by mouth as needed for moderate pain.    [provider]  amLODipine (NORVASC) 10 MG tablet Take 10 mg by mouth daily.    [provider]  atenolol (TENORMIN) 25 MG tablet Take 25 mg by mouth at bedtime.    [provider]  BREZTRI AEROSPHERE 160-9-4.8 MCG/ACT AERO Inhale 2 puffs into the lungs 2 (two) times daily. 11/14/19   Glenford Bayley, NP  busPIRone (BUSPAR) 5 MG tablet Take 5 mg by mouth 2 (two) times daily. 05/05/21   [provider]  citalopram (CELEXA) 10 MG tablet Take 10 mg by mouth daily.    [provider]  predniSONE (DELTASONE) 10 MG tablet Take 4 tablets (40 mg total) by mouth daily for 3 days. 12/24/23 12/27/23  Coral Spikes, DO  VENTOLIN HFA 108 (90 Base) MCG/ACT inhaler Inhale 2 puffs into the lungs 3 (three) times daily as needed for wheezing or shortness of breath. 02/09/19   [provider]      Allergies    Sulfa  antibiotics, Codeine, and Penicillins    Review of Systems   Review of Systems  Respiratory:  Positive for shortness of breath.     Physical Exam Updated Vital Signs BP (!) 141/84   Pulse 75   Temp 98.1 F (36.7 C) (Oral)   Resp (!) 23   Ht 4\' 10"  (1.473 m)   Wt 44 kg   SpO2 100%   BMI 20.27 kg/m  Physical Exam Vitals and nursing note reviewed.  Constitutional:      General: She is not in acute distress.    Appearance: She is not toxic-appearing.  Cardiovascular:     Rate and Rhythm: Normal rate and regular rhythm.  Pulmonary:     Effort: Pulmonary effort is normal.     Breath sounds: Wheezing present.  Chest:     Chest wall: No tenderness.  Abdominal:     Palpations: Abdomen is soft.     Tenderness: There is no abdominal tenderness.  Musculoskeletal:     Right lower leg: No edema.     Left lower leg: No edema.  Skin:    General: Skin is warm.     Capillary Refill: Capillary refill takes less than 2 seconds.  Neurological:     Mental Status: She is alert and oriented to person, place, and time.  Psychiatric:        Mood and Affect:  Mood normal.        Behavior: Behavior normal.     ED Results / Procedures / Treatments   Labs (all labs ordered are listed, but only abnormal results are displayed) Labs Reviewed  CBC - Abnormal; Notable for the following components:      Result Value   WBC 11.8 (*)    All other components within normal limits  BASIC METABOLIC PANEL WITH GFR  HEPATIC FUNCTION PANEL  TROPONIN I (HIGH SENSITIVITY)    EKG EKG Interpretation Date/Time:  Thursday December 23 2023 13:05:30 EDT Ventricular Rate:  74 PR Interval:  132 QRS Duration:  110 QT Interval:  456 QTC Calculation: 506 R Axis:   -76  Text Interpretation: Normal sinus rhythm Left axis deviation Minimal voltage criteria for LVH, may be normal variant ( Cornell product ) Anteroseptal infarct , age undetermined Abnormal ECG When compared with ECG of 23-Dec-2023 12:56, PREVIOUS  ECG IS PRESENT Confirmed by Estanislado Pandy [phone]) on 12/23/2023 2:44:18 PM  Radiology DG Chest 2 View Result Date: 12/23/2023 CLINICAL DATA:  Two day history of cough and right-sided rib pain associated with shortness of breath EXAM: CHEST - 2 VIEW COMPARISON:  Chest radiograph dated 12/03/2022, CT chest dated 09/13/2023 FINDINGS: Similar hyperinflated lungs with biapical lucency, right-greater-than-left. Unchanged calcified left upper lobe nodule. No pleural effusion or pneumothorax. The heart size and mediastinal contours are within normal limits. No acute osseous abnormality. IMPRESSION: 1. No acute cardiopulmonary process. 2. Similar findings of emphysema. Electronically Signed   By: Agustin Cree M.D.   On: 12/23/2023 13:14    Procedures Procedures    Medications Ordered in ED Medications  predniSONE (DELTASONE) tablet 60 mg (has no administration in time range)  albuterol (PROVENTIL) (2.5 MG/3ML) 0.083% nebulizer solution 5 mg (5 mg Nebulization Given 12/23/23 1522)  ipratropium (ATROVENT) nebulizer solution 0.5 mg (0.5 mg Nebulization Given 12/23/23 1522)    ED Course/ Medical Decision Making/ A&P                                 Medical Decision Making This is a 68 year old female with COPD on 2-3 L of oxygen at baseline presents emergency department for shortness of breath and right side pain.  She is afebrile nontachycardic maintaining her oxygen saturation on her home oxygen level.  Does not appear to be in significant respiratory distress.  Does have some prolonged expiratory phase and some wheezing.  Mild COPD exacerbation.  Steroid DuoNeb ordered.  Chest x-ray without pneumonia, pneumothorax or wide mediastinum on my independent interpretation.  Does have mild leukocytosis, was recently started on steroid inhaler I suspect reactionary.  No significant metabolic derangements.  With the right upper quadrant/right side pain, seemingly more musculoskeletal as it typically hurts with  movement, and coughing.  She has benign abdominal exam.  BMP without abnormalities.  Hepatic function panel normal.  She does not have a gallbladder.  Low suspicion for hepatobiliary disease.  Will forego imaging at this time.  Awaiting troponin.  Care signed out to afternoon team; dispo pending lab results. Appropriate for discharge if no significant abnormalities.  Update; Labs resulted shortly after signout. Negative troponin. Stable for discharge.    Amount and/or Complexity of Data Reviewed Independent Historian:     Details: Daughter notes patient baseline oxygen requirements External Data Reviewed:     Details: Had a CTA chest last year with pneumonia, but no other significant abnormalities. Labs: ordered.  Decision-making details documented in ED Course. Radiology: ordered. ECG/medicine tests:     Details: On my independent interpretation, no ST segment changes that would indicate ischemia.  Appears to be sinus rhythm. Discussion of management or test interpretation with external provider(s): None  Risk Prescription drug management. Decision regarding hospitalization.          Final Clinical Impression(s) / ED Diagnoses Final diagnoses:  COPD exacerbation (HCC)    Rx / DC Orders ED Discharge Orders          Ordered    predniSONE (DELTASONE) 10 MG tablet  Daily,   Status:  Discontinued        12/23/23 1628    predniSONE (DELTASONE) 10 MG tablet  Daily        12/23/23 1647              Coral Spikes, DO 12/23/23 1641    Coral Spikes, DO 12/23/23 1649

## 2023-12-23 NOTE — ED Triage Notes (Signed)
 Pt. Ststed, Ive been really SOB since yesterday and unable to do anything but sit without getting out of breath. Im also having right side pain RUQ

## 2023-12-23 NOTE — ED Provider Triage Note (Signed)
 Emergency Medicine Provider Triage Evaluation Note  Jodi Curtis , a 68 y.o. female  was evaluated in triage.  Pt complains of shob worse with exertion, ongoing since yesterday.  She also endorses some right upper quadrant pain.  She does have a history of gallbladder removal, the pain is worse with leaning forward.  Reports some wheezing yesterday,.  He does report that shortness of breath is worse with exertion..  Review of Systems  Positive: Sob Negative: Chest pain  Physical Exam  BP 112/65 (BP Location: Right Arm)   Pulse 78   Temp 98.1 F (36.7 C)   Resp 16   Ht 4\' 10"  (1.473 m)   Wt 44 kg   SpO2 94%   BMI 20.27 kg/m  Gen:   Awake, no distress   Resp:  Normal effort  MSK:   Moves extremities without difficulty  Other:  No wheezing, rhonchi, stridor, rales, some ttp in ruq  Medical Decision Making  Medically screening exam initiated at 12:31 PM.  Appropriate orders placed.  Jodi Curtis was informed that the remainder of the evaluation will be completed by another provider, this initial triage assessment does not replace that evaluation, and the importance of remaining in the ED until their evaluation is complete.  Workup initiated in triage    Olene Floss, [place] 12/23/23 1232

## 2024-02-10 DIAGNOSIS — Z9981 Dependence on supplemental oxygen: Secondary | ICD-10-CM | POA: Insufficient documentation

## 2024-02-10 DIAGNOSIS — I700 Atherosclerosis of aorta: Secondary | ICD-10-CM | POA: Insufficient documentation

## 2024-02-10 DIAGNOSIS — G4733 Obstructive sleep apnea (adult) (pediatric): Secondary | ICD-10-CM | POA: Insufficient documentation

## 2024-02-10 DIAGNOSIS — F339 Major depressive disorder, recurrent, unspecified: Secondary | ICD-10-CM | POA: Insufficient documentation

## 2024-02-10 DIAGNOSIS — M189 Osteoarthritis of first carpometacarpal joint, unspecified: Secondary | ICD-10-CM | POA: Insufficient documentation

## 2024-02-10 DIAGNOSIS — R4189 Other symptoms and signs involving cognitive functions and awareness: Secondary | ICD-10-CM | POA: Insufficient documentation

## 2024-02-10 DIAGNOSIS — F17200 Nicotine dependence, unspecified, uncomplicated: Secondary | ICD-10-CM | POA: Insufficient documentation

## 2024-02-10 DIAGNOSIS — J309 Allergic rhinitis, unspecified: Secondary | ICD-10-CM | POA: Insufficient documentation

## 2024-02-10 DIAGNOSIS — K219 Gastro-esophageal reflux disease without esophagitis: Secondary | ICD-10-CM | POA: Insufficient documentation

## 2024-02-10 DIAGNOSIS — R32 Unspecified urinary incontinence: Secondary | ICD-10-CM | POA: Insufficient documentation

## 2024-02-10 DIAGNOSIS — M4690 Unspecified inflammatory spondylopathy, site unspecified: Secondary | ICD-10-CM | POA: Insufficient documentation

## 2024-02-10 DIAGNOSIS — E559 Vitamin D deficiency, unspecified: Secondary | ICD-10-CM | POA: Insufficient documentation

## 2024-02-11 DIAGNOSIS — M545 Low back pain, unspecified: Secondary | ICD-10-CM | POA: Insufficient documentation

## 2024-02-28 ENCOUNTER — Ambulatory Visit: Admitting: Podiatry

## 2024-03-01 ENCOUNTER — Telehealth: Payer: Self-pay

## 2024-03-01 NOTE — Telephone Encounter (Signed)
 Medical Records from [facility] has been received for MR to review

## 2024-03-14 ENCOUNTER — Encounter: Payer: Self-pay | Admitting: Internal Medicine

## 2024-03-14 ENCOUNTER — Ambulatory Visit (INDEPENDENT_AMBULATORY_CARE_PROVIDER_SITE_OTHER): Admitting: Internal Medicine

## 2024-03-14 VITALS — BP 106/68 | HR 100 | Ht <= 58 in | Wt 90.4 lb

## 2024-03-14 DIAGNOSIS — R053 Chronic cough: Secondary | ICD-10-CM

## 2024-03-14 DIAGNOSIS — Z122 Encounter for screening for malignant neoplasm of respiratory organs: Secondary | ICD-10-CM

## 2024-03-14 DIAGNOSIS — E8801 Alpha-1-antitrypsin deficiency: Secondary | ICD-10-CM

## 2024-03-14 DIAGNOSIS — J449 Chronic obstructive pulmonary disease, unspecified: Secondary | ICD-10-CM

## 2024-03-14 DIAGNOSIS — J441 Chronic obstructive pulmonary disease with (acute) exacerbation: Secondary | ICD-10-CM | POA: Diagnosis not present

## 2024-03-14 DIAGNOSIS — J9612 Chronic respiratory failure with hypercapnia: Secondary | ICD-10-CM | POA: Diagnosis not present

## 2024-03-14 DIAGNOSIS — J9611 Chronic respiratory failure with hypoxia: Secondary | ICD-10-CM

## 2024-03-14 DIAGNOSIS — R0609 Other forms of dyspnea: Secondary | ICD-10-CM

## 2024-03-14 DIAGNOSIS — Z87891 Personal history of nicotine dependence: Secondary | ICD-10-CM | POA: Diagnosis not present

## 2024-03-14 LAB — CBC WITH DIFFERENTIAL/PLATELET
Basophils Absolute: 0.1 10*3/uL (ref 0.0–0.1)
Basophils Relative: 0.4 % (ref 0.0–3.0)
Eosinophils Absolute: 0 10*3/uL (ref 0.0–0.7)
Eosinophils Relative: 0.3 % (ref 0.0–5.0)
HCT: 39.4 % (ref 36.0–46.0)
Hemoglobin: 12.6 g/dL (ref 12.0–15.0)
Lymphocytes Relative: 7.2 % — ABNORMAL LOW (ref 12.0–46.0)
Lymphs Abs: 1.1 10*3/uL (ref 0.7–4.0)
MCHC: 32 g/dL (ref 30.0–36.0)
MCV: 88.8 fl (ref 78.0–100.0)
Monocytes Absolute: 0.9 10*3/uL (ref 0.1–1.0)
Monocytes Relative: 6.3 % (ref 3.0–12.0)
Neutro Abs: 12.8 10*3/uL — ABNORMAL HIGH (ref 1.4–7.7)
Neutrophils Relative %: 85.8 % — ABNORMAL HIGH (ref 43.0–77.0)
Platelets: 297 10*3/uL (ref 150.0–400.0)
RBC: 4.44 Mil/uL (ref 3.87–5.11)
RDW: 13.5 % (ref 11.5–15.5)
WBC: 15 10*3/uL — ABNORMAL HIGH (ref 4.0–10.5)

## 2024-03-14 LAB — BRAIN NATRIURETIC PEPTIDE: Pro B Natriuretic peptide (BNP): 34 pg/mL (ref 0.0–100.0)

## 2024-03-14 MED ORDER — VENTOLIN HFA 108 (90 BASE) MCG/ACT IN AERS: 2.0000 | INHALATION_SPRAY | RESPIRATORY_TRACT | 3 refills | Status: DC | PRN

## 2024-03-14 MED ORDER — ALBUTEROL SULFATE HFA 108 (90 BASE) MCG/ACT IN AERS: 2.0000 | INHALATION_SPRAY | Freq: Four times a day (QID) | RESPIRATORY_TRACT | 2 refills | Status: AC | PRN

## 2024-03-14 NOTE — Patient Instructions (Addendum)
 ICD-10-CM   1. Chronic respiratory failure with hypoxia and hypercapnia (HCC)  J96.11    J96.12     2. Stage 4 very severe COPD by GOLD classification (HCC)  J44.9     3. COPD, frequent exacerbations (HCC)  J44.1     4. Alpha-1-antitrypsin deficiency (HCC)  E88.01     5. Stopped smoking with greater than 30 pack year history  Z87.891     6. Screening for lung cancer  Z12.2     7. DOE (dyspnea on exertion)  R06.09     8. Chronic cough  R05.3       It was an honor take care of your late husband Mr Miranda America Thanks for trusting me with your care When you have advanced COPD I think there are things that we can do to try to optimize your lung health  Plan  - Recheck spirometry and DLCO in the next 8 weeks - Do high-resolution CT chest supine and prone for shortness of breath and cough. - Continue Breztri  2 puffs 2 times daily - Continue oxygen 3 L at rest and 4 L with exertion to keep pulse ox greater than 88% - Continue albuterol  as needed  - But change albuterol  HFA TO  Ventolin  - Check CBC with differential  - to see if you qualify for Dupixent - START OTHUVAYRE nebulizer twice daily  - We will have to see if insurance will approve this  0 do high-resolution CT chest supine and prone in the next 8 weeks overall okay - do ECHO - do BNP - Ideally would also need BiPAP at at night but respect the fact that you do not tolerate it   Follow-up - 8 weeks with Dr. Bertrum Brodie to discuss results.

## 2024-03-14 NOTE — Progress Notes (Signed)
 OV 03/14/2024 -new consult.  Last seen by Dr. Vernestine Gondola in [place] greater than 3 years ago  Subjective:  Patient ID: Jodi Curtis, female , DOB: September 05, 1956 , age 68 y.o. , MRN: [id number] , ADDRESS: [address] [place] [address] PCP Marce Sensing, NP Patient Care Team: Marce Sensing, NP as PCP - General  This Provider for this visit: Treatment Team:  Attending Provider: Maire Scot, MD    03/14/2024 -   Chief Complaint  Patient presents with   Pulmonary Consult    Self referral for COPD.      HPI Jodi Curtis 68 y.o. -is a widow of my patient Jodi Curtis who died in March 24, 2017.  He suffered from advanced COPD and cor pulmonale.  She now presents with her daughter Jodi Curtis.  Jamie used to come with her dad on all the office visits back in the old office.  Jodi Curtis reports that after patient's husband/her dad passed away in 03-24-2017 patient started having respiratory complaints but was in denial for a while.  Then in 03/25/19 she saw both Dr. Vernestine Gondola and [place].  At the time alpha-1 was normal levels but SS.  Gold stage IV COPD was diagnosed based on FEV1 0.6 L / 28%.  However because of COVID patient did not follow-up.  She subsequently established with an room air at [facility] and was attending visits there.  External record review shows this.  She has had admissions from COPD exacerbation.  She has been maintained on Breztri .  Some 3 years ago she ended up on oxygen 24/7.  She was on 2 L but apparently in the last year she has been on 3 L at rest 4 L with exertion.  On room air today she was pulse ox 81%.  Daughter says this is kind of normal for her.  She suffers from chronic cough and shortness of breath.  I could not find an echo in the system.  Last CT scan of the chest was in 03-25-23.  They are transferring care to me because of meeting care of her dad in the past.  Daughter is interested in new potential treatments.  Smoking history  makes eligible for lung cancer screening  No echocardiogram noticed  03-25-2023 ABG shows hypercarbia patient tried BiPAP but did not tolerate  Alpha-1 is SS but because of COVID there was lost opportunity to discuss this.  She does not like albuterol  HFA.  Daughter thinks this does not work.  Asking for Ventolin .    PUSLE O X 81% Ra    Other social history - Daughter Jodi Curtis works now in a [employer] as a Engineer, manufacturing.  She has been doing there for 3 years.  This in [place] - Granddaughter is attending [employer] is a senior - Dietra Fraction is going to become an Designer, multimedia  PFT     Latest Ref Rng & Units 10/11/2019    2:58 PM  PFT Results  FVC-Pre L 1.25   FVC-Predicted Pre % 49   FVC-Post L 1.28   FVC-Predicted Post % 50   Pre FEV1/FVC % % 44   Post FEV1/FCV % % 45   FEV1-Pre L 0.55   FEV1-Predicted Pre % 28   FEV1-Post L 0.58   DLCO uncorrected ml/min/mmHg 4.79   DLCO UNC% % 29   DLVA Predicted % 41      INDINGS: CTA CHEST FINDINGS  Cardiovascular: Satisfactory opacification of the pulmonary arteries to the segmental level. No evidence of pulmonary embolism. Normal heart size. No pericardial effusion.   Mediastinum/Nodes: There is a mildly enlarged subcarinal lymph node measuring cm, new from prior. There are mildly enlarged bilateral hilar lymph nodes measuring up to 1 cm. Visualized esophagus and thyroid gland are within normal limits.   Lungs/Pleura: Emphysematous changes are again seen and similar to prior. There is some central peribronchial wall thickening bilaterally. There is a small amount of patchy airspace disease in the left lung base/left lower lobe and within the lingula, new from prior. There is some scattered tree-in-bud opacities in the left lower lobe, new from prior. Calcified granuloma in the left upper lobe appears stable.   Musculoskeletal: No chest wall abnormality. No acute or significant osseous  findings.   Review of the MIP images confirms the above findings.   CT ABDOMEN and PELVIS FINDINGS   Hepatobiliary: No focal liver abnormality is seen. Status post cholecystectomy. No biliary dilatation.   Pancreas: Unremarkable. No pancreatic ductal dilatation or surrounding inflammatory changes.   Spleen: Normal in size without focal abnormality.   Adrenals/Urinary Tract: Adrenal glands are unremarkable. Kidneys are normal, without renal calculi, focal lesion, or hydronephrosis. Bladder is unremarkable.   Stomach/Bowel: Stomach is within normal limits. Appendix appears normal. No evidence of bowel wall thickening, distention, or inflammatory changes.   Vascular/Lymphatic: Aortic atherosclerosis. No enlarged abdominal or pelvic lymph nodes.   Reproductive: Uterus and bilateral adnexa are unremarkable.   Other: No abdominal wall hernia or abnormality. No abdominopelvic ascites.   Musculoskeletal: Degenerative changes affect the spine. There is levoconvex curvature of the lumbar spine.   Review of the MIP images confirms the above findings.   IMPRESSION: 1. No evidence for pulmonary embolism. 2. New patchy and tree-in-bud opacities in the left lower lobe and lingula compatible with infection/inflammation. 3. New mild mediastinal and hilar lymphadenopathy, likely reactive. 4. No acute localizing process in the abdomen or pelvis.   Aortic Atherosclerosis (ICD10-I70.0) and Emphysema (ICD10-J43.9).     Electronically Signed   By: Tyron Gallon M.D.   On: 11/23/2022 20:34  LAB RESULTS last 96 hours No results found.       has a past medical history of COPD (chronic obstructive pulmonary disease) (HCC) and Hypertension.   reports that she quit smoking about 7 years ago. Her smoking use included cigarettes. She started smoking about 37 years ago. She has a 30 pack-year smoking history. She has never used smokeless tobacco.  Past Surgical History:  Procedure  Laterality Date   CHOLECYSTECTOMY     TUBAL LIGATION      Allergies  Allergen Reactions   Sulfa Antibiotics Anaphylaxis   Codeine Itching   Penicillins Other (See Comments)    Internal bleeding, welts     Immunization History  Administered Date(s) Administered   Influenza,inj,Quad PF,6+ Mos 08/12/2019    No family history on file.   Current Outpatient Medications:    albuterol  (VENTOLIN  HFA) 108 (90 Base) MCG/ACT inhaler, Inhale 2 puffs into the lungs every 6 (six) hours as needed for wheezing or shortness of breath., Disp: 8 g, Rfl: 2   atenolol  (TENORMIN ) 25 MG tablet, Take 25 mg by mouth at bedtime., Disp: , Rfl:    BREZTRI  AEROSPHERE 160-9-4.8 MCG/ACT AERO, Inhale 2 puffs into the lungs 2 (two) times daily., Disp: 10.7 g, Rfl: 6   busPIRone  (BUSPAR ) 5 MG tablet, Take 5 mg by mouth 2 (two) times daily., Disp: ,  Rfl:    citalopram  (CELEXA ) 10 MG tablet, Take 10 mg by mouth daily., Disp: , Rfl:    meloxicam (MOBIC) 15 MG tablet, Take 15 mg by mouth daily., Disp: , Rfl:    amLODipine (NORVASC) 10 MG tablet, Take 10 mg by mouth daily., Disp: , Rfl:    busPIRone  (BUSPAR ) 10 MG tablet, Take 1 tablet by mouth 2 (two) times daily., Disp: , Rfl:    citalopram  (CELEXA ) 40 MG tablet, Take 40 mg by mouth daily., Disp: , Rfl:    VENTOLIN  HFA 108 (90 Base) MCG/ACT inhaler, Inhale 2 puffs into the lungs every 4 (four) hours as needed for wheezing or shortness of breath., Disp: 1 each, Rfl: 3      Objective:   Vitals:   03/14/24 1425 03/14/24 1458  BP: 106/68   Pulse: 100   SpO2: 98% (!) 81%  Weight: 90 lb 6.4 oz (41 kg)   Height: 4' 9 (1.448 m)     Estimated body mass index is 19.56 kg/m as calculated from the following:   Height as of this encounter: 4' 9 (1.448 m).   Weight as of this encounter: 90 lb 6.4 oz (41 kg).  @WEIGHTCHANGE @  [employer]   03/14/24 1425  Weight: 90 lb 6.4 oz (41 kg)     Physical Exam   General: No distress. Thin. [place] + O2 at rest:  no Cane present: no Sitting in wheel chair: no Frail: YES Obese: no Neuro: Alert and Oriented x 3. GCS 15. Speech normal Psych: Pleasant Resp:  Barrel Chest - YES Wheeze -no, Crackles - no, No overt respiratory distress CVS: Normal heart sounds. Murmurs - no Ext: Stigmata of Connective Tissue Disease - no HEENT: Normal upper airway. PEERL +. No post nasal drip        Assessment:       ICD-10-CM   1. Chronic respiratory failure with hypoxia and hypercapnia (HCC)  J96.11 Pulmonary function test   J96.12 CT Chest High Resolution    CBC w/Diff    ECHOCARDIOGRAM COMPLETE    B Nat Peptide    2. Stage 4 very severe COPD by GOLD classification (HCC)  J44.9 Pulmonary function test    CT Chest High Resolution    CBC w/Diff    ECHOCARDIOGRAM COMPLETE    B Nat Peptide    3. COPD, frequent exacerbations (HCC)  J44.1 Pulmonary function test    CT Chest High Resolution    CBC w/Diff    ECHOCARDIOGRAM COMPLETE    B Nat Peptide    4. Alpha-1-antitrypsin deficiency (HCC)  E88.01 Pulmonary function test    CT Chest High Resolution    CBC w/Diff    ECHOCARDIOGRAM COMPLETE    B Nat Peptide    5. Stopped smoking with greater than 30 pack year history  Z87.891 Pulmonary function test    CT Chest High Resolution    CBC w/Diff    ECHOCARDIOGRAM COMPLETE    B Nat Peptide    6. Screening for lung cancer  Z12.2 Pulmonary function test    CT Chest High Resolution    CBC w/Diff    ECHOCARDIOGRAM COMPLETE    B Nat Peptide    7. DOE (dyspnea on exertion)  R06.09 Pulmonary function test    CT Chest High Resolution    CBC w/Diff    ECHOCARDIOGRAM COMPLETE    B Nat Peptide    8. Chronic cough  R05.3 Pulmonary function test    CT Chest  High Resolution    CBC w/Diff    ECHOCARDIOGRAM COMPLETE    B Nat Peptide         Plan:     Patient Instructions     ICD-10-CM   1. Chronic respiratory failure with hypoxia and hypercapnia (HCC)  J96.11    J96.12     2. Stage 4 very severe  COPD by GOLD classification (HCC)  J44.9     3. COPD, frequent exacerbations (HCC)  J44.1     4. Alpha-1-antitrypsin deficiency (HCC)  E88.01     5. Stopped smoking with greater than 30 pack year history  Z87.891     6. Screening for lung cancer  Z12.2     7. DOE (dyspnea on exertion)  R06.09     8. Chronic cough  R05.3       It was an honor take care of your late husband Mr Miranda America Thanks for trusting me with your care When you have advanced COPD I think there are things that we can do to try to optimize your lung health  Plan  - Recheck spirometry and DLCO in the next 8 weeks - Do high-resolution CT chest supine and prone for shortness of breath and cough. - Continue Breztri  2 puffs 2 times daily - Continue oxygen 3 L at rest and 4 L with exertion to keep pulse ox greater than 88% - Continue albuterol  as needed  - But change albuterol  HFA TO  Ventolin  - Check CBC with differential  - to see if you qualify for Dupixent - START OTHUVAYRE nebulizer twice daily  - We will have to see if insurance will approve this  0 do high-resolution CT chest supine and prone in the next 8 weeks overall okay - do ECHO - do BNP - Ideally would also need BiPAP at at night but respect the fact that you do not tolerate it   Follow-up - 8 weeks with Dr. Bertrum Brodie to discuss results.    FOLLOWUP Return in about 8 weeks (around 05/09/2024) for with Dr Bertrum Brodie, Face to Face Visit.    SIGNATURE    Dr. Maire Scot, M.D., F.C.C.P,  Pulmonary and Critical Care Medicine Staff Physician, [facility] Director - Interstitial Lung Disease  Program  Pulmonary Fibrosis [facility] [place], [place], [address]  Pager: [phone], If no answer or between  15:00h - 7:00h: call [phone] Telephone: [phone]  3:01 PM 03/14/2024

## 2024-03-22 ENCOUNTER — Telehealth: Payer: Self-pay

## 2024-03-22 NOTE — Telephone Encounter (Signed)
 Received Ohtuvayre new start paperwork. Completed form and faxed with clinicals and insurance card copy to [facility]   Phone#: [phone] Fax#: [phone]

## 2024-03-23 NOTE — Telephone Encounter (Signed)
 Received fax from [employer] confirming receipt of Ohtuvayre  enrollment form  Patient ID: [id number]

## 2024-03-29 NOTE — Telephone Encounter (Signed)
 Received fax from [employer] with summary of benefits. Referral form for Ohtuvayre  received. Rx will be triaged to [facility].. Once benefits investigation completed, pharmacy will reach out the patient to schedule shipment. If medication is unaffordable, patient will need to express financial hardship to be referred back to [place] [facility] for patient assistance program pre-screening.   Patient ID: [id number] Pharmacy phone: [phone] [place] [facility] Phone#: [phone]

## 2024-03-30 ENCOUNTER — Ambulatory Visit: Attending: Internal Medicine

## 2024-03-30 DIAGNOSIS — E8801 Alpha-1-antitrypsin deficiency: Secondary | ICD-10-CM

## 2024-03-30 DIAGNOSIS — J449 Chronic obstructive pulmonary disease, unspecified: Secondary | ICD-10-CM

## 2024-03-30 DIAGNOSIS — J441 Chronic obstructive pulmonary disease with (acute) exacerbation: Secondary | ICD-10-CM

## 2024-03-30 DIAGNOSIS — R053 Chronic cough: Secondary | ICD-10-CM

## 2024-03-30 DIAGNOSIS — Z122 Encounter for screening for malignant neoplasm of respiratory organs: Secondary | ICD-10-CM

## 2024-03-30 DIAGNOSIS — R0609 Other forms of dyspnea: Secondary | ICD-10-CM

## 2024-03-30 DIAGNOSIS — J9611 Chronic respiratory failure with hypoxia: Secondary | ICD-10-CM | POA: Diagnosis not present

## 2024-03-30 DIAGNOSIS — J9612 Chronic respiratory failure with hypercapnia: Secondary | ICD-10-CM

## 2024-03-30 DIAGNOSIS — Z87891 Personal history of nicotine dependence: Secondary | ICD-10-CM

## 2024-03-30 MED ORDER — PERFLUTREN LIPID MICROSPHERE
1.0000 mL | INTRAVENOUS | Status: AC | PRN
  Administered 2024-03-30: 3 mL via INTRAVENOUS

## 2024-03-31 LAB — ECHOCARDIOGRAM COMPLETE
AR max vel: 1.85 cm2
AV Area VTI: 2.32 cm2
AV Area mean vel: 2 cm2
AV Mean grad: 4 mmHg
AV Peak grad: 9.5 mmHg
AV Vena cont: 0.7 cm
Ao pk vel: 1.54 m/s
Area-P 1/2: 3.08 cm2
Calc EF: 39.9 %
MV VTI: 1.18 cm2
P 1/2 time: 497 ms
S' Lateral: 2.7 cm
Single Plane A2C EF: 38.3 %
Single Plane A4C EF: 40.8 %

## 2024-04-03 NOTE — Telephone Encounter (Signed)
 Submitted a Prior Authorization request to [facility] [employer] for OHTUVAYRE  via CoverMyMeds. Will update once we receive a response.  Key: [id number]

## 2024-04-04 NOTE — Telephone Encounter (Signed)
 Received notification from [employer] regarding a prior authorization for OHTUVAYRE . Authorization has been APPROVED from 04/03/2024 to 09/27/2024. Approval letter sent to scan center.  Authorization # [id number]  Sherry Pennant, PharmD, MPH, BCPS, CPP Clinical Pharmacist (Rheumatology and Pulmonology)

## 2024-04-07 ENCOUNTER — Ambulatory Visit
Admission: RE | Admit: 2024-04-07 | Discharge: 2024-04-07 | Disposition: A | Discharge status: Home / Self Care | Source: Ambulatory Visit | Attending: Internal Medicine | Admitting: Internal Medicine

## 2024-04-07 DIAGNOSIS — Z87891 Personal history of nicotine dependence: Secondary | ICD-10-CM

## 2024-04-07 DIAGNOSIS — R053 Chronic cough: Secondary | ICD-10-CM

## 2024-04-07 DIAGNOSIS — J441 Chronic obstructive pulmonary disease with (acute) exacerbation: Secondary | ICD-10-CM

## 2024-04-07 DIAGNOSIS — J449 Chronic obstructive pulmonary disease, unspecified: Secondary | ICD-10-CM

## 2024-04-07 DIAGNOSIS — J9611 Chronic respiratory failure with hypoxia: Secondary | ICD-10-CM

## 2024-04-07 DIAGNOSIS — Z122 Encounter for screening for malignant neoplasm of respiratory organs: Secondary | ICD-10-CM

## 2024-04-07 DIAGNOSIS — R0609 Other forms of dyspnea: Secondary | ICD-10-CM

## 2024-04-07 DIAGNOSIS — E8801 Alpha-1-antitrypsin deficiency: Secondary | ICD-10-CM

## 2024-04-14 NOTE — Telephone Encounter (Signed)
 Received a fax from  [employer] regarding an approval for OHTUVAYRE  patient assistance from 04/14/2024 to 09/27/2024. Approval letter sent to scan center.  Phone#: [phone] Fax#: [phone]  Rx will be forwarded to [facility]  Sherry Pennant, PharmD, MPH, BCPS, CPP Clinical Pharmacist (Rheumatology and Pulmonology)

## 2024-04-15 ENCOUNTER — Ambulatory Visit: Payer: Self-pay | Admitting: Internal Medicine

## 2024-04-15 NOTE — Progress Notes (Signed)
 Pls keep your appt on 04/19/24 to discuss these results Xxxxx PRESSION: 1. Multiple new irregular consolidations in the lingula. New irregular opacity in the dependent left lower lobe. Findings are consistent with multifocal infection or aspiration. 2. Severe emphysema and diffuse bilateral bronchial wall thickening. 3. Small pulmonary nodules unchanged, some of which are calcified and consistent with benign sequelae of prior infection or inflammation. Consider ongoing annual low-dose CT lung cancer screening if indicated by patient age, smoking history, and/or other risk factors for lung cancer.   Aortic Atherosclerosis (ICD10-I70.0) and Emphysema (ICD10-J43.9).     Electronically Signed   By: Marolyn JONETTA Jaksch M.D.   On: 04/11/2024 10:15

## 2024-04-19 ENCOUNTER — Encounter: Payer: Self-pay | Admitting: Internal Medicine

## 2024-04-19 ENCOUNTER — Ambulatory Visit (INDEPENDENT_AMBULATORY_CARE_PROVIDER_SITE_OTHER): Admitting: Internal Medicine

## 2024-04-19 ENCOUNTER — Ambulatory Visit: Admitting: Internal Medicine

## 2024-04-19 VITALS — BP 118/70 | HR 62 | Ht <= 58 in | Wt 91.0 lb

## 2024-04-19 DIAGNOSIS — J441 Chronic obstructive pulmonary disease with (acute) exacerbation: Secondary | ICD-10-CM

## 2024-04-19 DIAGNOSIS — Z87891 Personal history of nicotine dependence: Secondary | ICD-10-CM

## 2024-04-19 DIAGNOSIS — E8801 Alpha-1-antitrypsin deficiency: Secondary | ICD-10-CM

## 2024-04-19 DIAGNOSIS — Z122 Encounter for screening for malignant neoplasm of respiratory organs: Secondary | ICD-10-CM

## 2024-04-19 DIAGNOSIS — R911 Solitary pulmonary nodule: Secondary | ICD-10-CM

## 2024-04-19 DIAGNOSIS — I519 Heart disease, unspecified: Secondary | ICD-10-CM

## 2024-04-19 DIAGNOSIS — J9612 Chronic respiratory failure with hypercapnia: Secondary | ICD-10-CM

## 2024-04-19 DIAGNOSIS — J449 Chronic obstructive pulmonary disease, unspecified: Secondary | ICD-10-CM

## 2024-04-19 DIAGNOSIS — J9611 Chronic respiratory failure with hypoxia: Secondary | ICD-10-CM | POA: Diagnosis not present

## 2024-04-19 DIAGNOSIS — R0609 Other forms of dyspnea: Secondary | ICD-10-CM

## 2024-04-19 DIAGNOSIS — R053 Chronic cough: Secondary | ICD-10-CM

## 2024-04-19 NOTE — Progress Notes (Signed)
 Patient unable to blow out long enough therefore, keep getting end of test criteria.

## 2024-04-19 NOTE — Patient Instructions (Signed)
PFT not performed. 

## 2024-04-19 NOTE — Progress Notes (Signed)
 OV 04/19/2024  Subjective:  Patient ID: Jodi Curtis, female , DOB: 1956/02/04 , age 68 y.o. , MRN: [id number] , ADDRESS: [address] [place] [address] PCP Jodi Angeline FALCON, NP Patient Care Team: Jodi Angeline FALCON, NP as PCP - General  This Provider for this visit: Treatment Team:  Attending Provider: Geronimo Amel, MD    04/19/2024 -   Chief Complaint  Patient presents with   Medical Management of Chronic Issues    Chronic Respiratory Failure. Breathing has been stable. She was unable to complete PFT today. She has a non prod cough.      HPI Jodi Curtis 68 y.o. -COPD follow-up.  Here for results of workup.  Presents with daughter Jodi Curtis and also granddaughter Jodi Curtis.  Patient is the widow of my late patient Mr. Jodi Curtis.  No interim complaints.  She continues on Breztri , continues on oxygen schedule. She has gotten OTHUVAYRE -first doses to start today.  He just arrived by mail.  There is no co-pay for her.  Her blood eosinophils are normal therefore she does not qualify for Dupixent.  She is reluctant to do any BiPAP at night.  Alpha-1 antitrypsin is SS and this typically does not qualify for alpha-1 replacement infusion although I did counsel the daughter and granddaughter to get this checked.  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  She is not interested in lung transplant as a care option.  CT scan shows emphysema no pulmonary fibrosis but she does have an new left lung nodule.  She is willing to get a follow-up CT  Echocardiogram shows ejection fraction 45%.  She says she is seeing cardiology at [facility] but details are not known.  She is willing to see them again.  Social   - Granddaughter Jodi Curtis is a Chief Strategy Officer at [employer] and is going to study exercise science. [place] Jodi Curtis was 58-74 years old when grandfather Mr. Jodi Curtis my patient passed away    PFT     Latest Ref Rng & Units 10/11/2019    2:58 PM  PFT  Results  FVC-Pre L 1.25   FVC-Predicted Pre % 49   FVC-Post L 1.28   FVC-Predicted Post % 50   Pre FEV1/FVC % % 44   Post FEV1/FCV % % 45   FEV1-Pre L 0.55   FEV1-Predicted Pre % 28   FEV1-Post L 0.58   DLCO uncorrected ml/min/mmHg 4.79   DLCO UNC% % 29   DLVA Predicted % 41      IMPRESSION:  OV 03/14/2024 -new consult.  Last seen by Dr. Ozell Curtis in [place] greater than 3 years ago  Subjective:  Patient ID: Jodi Curtis, female , DOB: [birth year] , age 72 y.o. , MRN: [id number] , ADDRESS: [address] [place] [address] PCP Jodi Angeline FALCON, NP Patient Care Team: Jodi Angeline FALCON, NP as PCP - General  This Provider for this visit: Treatment Team:  Attending Provider: Geronimo Amel, MD    03/14/2024 -   Chief Complaint  Patient presents with   Pulmonary Consult    Self referral for COPD.      HPI Jodi Curtis 68 y.o. -is a widow of my patient Jodi Curtis who died in May 03, 2017.  He suffered from advanced COPD and cor pulmonale.  She now presents with her daughter Jodi Curtis.  Jodi Curtis used to come with her dad on all the office visits back in  the old office.  Jodi Curtis reports that after patient's husband/her dad passed away in May 16, 2017 patient started having respiratory complaints but was in denial for a while.  Then in 2019-05-17 she saw both Dr. Ozell Curtis and Jodi Curtis.  At the time alpha-1 was normal levels but SS.  Gold stage IV COPD was diagnosed based on FEV1 0.6 L / 28%.  However because of COVID patient did not follow-up.  She subsequently established with an room air at [facility] and was attending visits there.  External record review shows this.  She has had admissions from COPD exacerbation.  She has been maintained on Breztri .  Some 3 years ago she ended up on oxygen 24/7.  She was on 2 L but apparently in the last year she has been on 3 L at rest 4 L with exertion.  On room air today she was pulse ox 81%.  Daughter says this is kind of normal  for her.  She suffers from chronic cough and shortness of breath.  I could not find an echo in the system.  Last CT scan of the chest was in 05/17/2023.  They are transferring care to me because of meeting care of her dad in the past.  Daughter is interested in new potential treatments.  Smoking history makes eligible for lung cancer screening  No echocardiogram noticed  May 17, 2023 ABG shows hypercarbia patient tried BiPAP but did not tolerate  Alpha-1 is SS but because of COVID there was lost opportunity to discuss this.  She does not like albuterol  HFA.  Daughter thinks this does not work.  Asking for Ventolin .    PUSLE O X 81% Ra    Other social history - Daughter Jodi Curtis works now in a [employer] park [employer] as a Engineer, manufacturing.  She has been doing there for 3 years.  This in [place] - Granddaughter is attending [employer] is a senior - Darden is going to become an Designer, multimedia 1. Multiple new irregular consolidations in the lingula. New irregular opacity in the dependent left lower lobe. Findings are consistent with multifocal infection or aspiration. 2. Severe emphysema and diffuse bilateral bronchial wall thickening. 3. Small pulmonary nodules unchanged, some of which are calcified and consistent with benign sequelae of prior infection or inflammation. Consider ongoing annual low-dose CT lung cancer screening if indicated by patient age, smoking history, and/or other risk factors for lung cancer.   Aortic Atherosclerosis (ICD10-I70.0) and Emphysema (ICD10-J43.9).     Electronically Signed   By: Marolyn JONETTA Jaksch M.D.   On: 04/11/2024 10:15   LAB RESULTS last 96 hours No results found.   IMPRESSIONS  - ECHO May 16, 2024   1. Dyskinetic med and distal septum.. Left ventricular ejection fraction,  by estimation, is 45 to 50%. The left ventricle has mildly decreased  function. The left ventricle has no regional wall motion abnormalities.  Left ventricular  diastolic parameters  are consistent with Grade I diastolic dysfunction (impaired relaxation).  The average left ventricular global longitudinal strain is -20.1 %. The  global longitudinal strain is normal.   2. Right ventricular systolic function is normal. The right ventricular  size is normal. There is mildly elevated pulmonary artery systolic  pressure.   3. The mitral valve is normal in structure. No evidence of mitral valve  regurgitation. No evidence of mitral stenosis.   4. The aortic valve is normal in structure. Aortic valve regurgitation is  mild. No aortic stenosis is present.  5. The inferior vena cava is normal in size with greater than 50%  respiratory variability, suggesting right atrial pressure of 3 mmHg.    Latest Reference Range & Units 11/23/22 17:13 11/25/22 08:53 03/14/24 15:07  Eosinophils Absolute 0.0 - 0.7 K/uL 0.2 0.0 0.0    Latest Reference Range & Units 02/11/19 23:50 02/12/19 02:33 11/23/22 17:13 11/23/22 19:35 12/23/23 15:46 03/14/24 15:07  Pro B Natriuretic peptide (BNP) 0.0 - 100.0 pg/mL      34.0  Troponin I <0.03 ng/mL <0.03 <0.03      Troponin I (High Sensitivity) <18 ng/L   15 15 8      has a past medical history of COPD (chronic obstructive pulmonary disease) (HCC) and Hypertension.   reports that she quit smoking about 7 years ago. Her smoking use included cigarettes. She started smoking about 37 years ago. She has a 30 pack-year smoking history. She has never used smokeless tobacco.  Past Surgical History:  Procedure Laterality Date   CHOLECYSTECTOMY     TUBAL LIGATION      Allergies  Allergen Reactions   Sulfa Antibiotics Anaphylaxis   Codeine Itching   Penicillins Other (See Comments)    Internal bleeding, welts     Immunization History  Administered Date(s) Administered   Influenza,inj,Quad PF,6+ Mos 08/12/2019   Pneumococcal Conjugate-13 05/21/2014   Pneumococcal Polysaccharide-23 06/21/2017   Zoster Recombinant(Shingrix)  05/03/2023, 11/15/2023    No family history on file.   Current Outpatient Medications:    albuterol  (VENTOLIN  HFA) 108 (90 Base) MCG/ACT inhaler, Inhale 2 puffs into the lungs every 6 (six) hours as needed for wheezing or shortness of breath., Disp: 8 g, Rfl: 2   atenolol  (TENORMIN ) 25 MG tablet, Take 25 mg by mouth at bedtime., Disp: , Rfl:    BREZTRI  AEROSPHERE 160-9-4.8 MCG/ACT AERO, Inhale 2 puffs into the lungs 2 (two) times daily., Disp: 10.7 g, Rfl: 6   busPIRone  (BUSPAR ) 10 MG tablet, Take 1 tablet by mouth 2 (two) times daily., Disp: , Rfl:    busPIRone  (BUSPAR ) 5 MG tablet, Take 5 mg by mouth 2 (two) times daily., Disp: , Rfl:    citalopram  (CELEXA ) 40 MG tablet, Take 40 mg by mouth daily., Disp: , Rfl:    meloxicam (MOBIC) 15 MG tablet, Take 15 mg by mouth daily., Disp: , Rfl:    VENTOLIN  HFA 108 (90 Base) MCG/ACT inhaler, Inhale 2 puffs into the lungs every 4 (four) hours as needed for wheezing or shortness of breath., Disp: 1 each, Rfl: 3   amLODipine (NORVASC) 10 MG tablet, Take 10 mg by mouth daily., Disp: , Rfl:    citalopram  (CELEXA ) 10 MG tablet, Take 10 mg by mouth daily., Disp: , Rfl:       Objective:   Vitals:   04/19/24 1537  BP: 118/70  Pulse: 62  SpO2: 98%  Weight: 91 lb (41.3 kg)  Height: 4' 9 (1.448 m)    Estimated body mass index is 19.69 kg/m as calculated from the following:   Height as of this encounter: 4' 9 (1.448 m).   Weight as of this encounter: 91 lb (41.3 kg).  @WEIGHTCHANGE @  [employer]   04/19/24 1537  Weight: 91 lb (41.3 kg)     Physical Exam   General: No distress. Looks well O2 at rest: yes Cane present: no Sitting in wheel chair: no Frail: no Obese: no Neuro: Alert and Oriented x 3. GCS 15. Speech normal Psych: Pleasant Resp:  Barrel Chest - yes.  Wheeze - no, Crackles - no, No overt respiratory distress CVS: Normal heart sounds. Murmurs - no Ext: Stigmata of Connective Tissue Disease - no HEENT: Normal upper  airway. PEERL +. No post nasal drip        Assessment/     Assessment & Plan Stage 4 very severe COPD by GOLD classification (HCC)  Chronic respiratory failure with hypoxia and hypercapnia (HCC)  COPD, frequent exacerbations (HCC)  Alpha-1-antitrypsin deficiency (HCC)  Nodule of lower lobe of left lung  Chronic systolic dysfunction of left ventricle    PLAN Patient Instructions     ICD-10-CM   1. Stage 4 very severe COPD by GOLD classification (HCC)  J44.9     2. Chronic respiratory failure with hypoxia and hypercapnia (HCC)  J96.11    J96.12     3. COPD, frequent exacerbations (HCC)  J44.1     4. Alpha-1-antitrypsin deficiency (HCC)  E88.01      #COPD  - avanced and gold stage 4   Plan  - - Continue Breztri  2 puffs 2 times daily - Continue oxygen 3 L at rest and 4 L with exertion to keep pulse ox greater than 88% - Continue albuterol  as needed  - But change albuterol  HFA TO  Ventolin  - - START OTHUVAYRE nebulizer twice daily  - first dose 04/19/2024   Glad insurance approved - Discussed transplant referral and respective desire to refuse - Normal eosinophils so no role for Dupixent or Nucala  - Respect prior reluctance to do BiPAP - Alpha-1 SS probably does not qualify for alpha-1 replacement infusion - At next visit consider pulmonary rehab referral  #Lung nodule left lower  Plan  - repeat CT chest in 3 month (no contrast)   # Chronic systolic dysfunction-ejection fraction 45% on echocardiogram July 2025  Plan - Refer cardiology   Follow-up - 12 weeks with Dr. Geronimo to discuss results.    FOLLOWUP    Return in about 3 months (around 07/20/2024) for 15 min visit, with Dr Jodi, with any of the APPS, Face to Face Visit.    SIGNATURE    Dr. Dorethia Jodi, M.D., F.C.C.P,  Pulmonary and Critical Care Medicine Staff Physician, [facility] Director - Interstitial Lung Disease  Program  Pulmonary Fibrosis  [facility] [place], [place], [address]  Pager: [phone], If no answer or between  15:00h - 7:00h: call [phone] Telephone: [phone]  5:23 PM 04/19/2024

## 2024-04-19 NOTE — Patient Instructions (Addendum)
 ICD-10-CM   1. Stage 4 very severe COPD by GOLD classification (HCC)  J44.9     2. Chronic respiratory failure with hypoxia and hypercapnia (HCC)  J96.11    J96.12     3. COPD, frequent exacerbations (HCC)  J44.1     4. Alpha-1-antitrypsin deficiency (HCC)  E88.01      #COPD  - avanced and gold stage 4   Plan  - - Continue Breztri  2 puffs 2 times daily - Continue oxygen 3 L at rest and 4 L with exertion to keep pulse ox greater than 88% - Continue albuterol  as needed  - But change albuterol  HFA TO  Ventolin  - - START OTHUVAYRE nebulizer twice daily  - first dose 04/19/2024   Glad insurance approved - Discussed transplant referral and respective desire to refuse - Normal eosinophils so no role for Dupixent or Nucala  - Respect prior reluctance to do BiPAP - Alpha-1 SS probably does not qualify for alpha-1 replacement infusion - At next visit consider pulmonary rehab referral  #Lung nodule left lower  Plan  - repeat CT chest in 3 month (no contrast)   # Chronic systolic dysfunction-ejection fraction 45% on echocardiogram July 2025  Plan - Refer cardiology   Follow-up - 12 weeks with Dr. Geronimo to discuss results.

## 2024-04-20 ENCOUNTER — Encounter: Payer: Self-pay | Admitting: Internal Medicine

## 2024-04-20 NOTE — Telephone Encounter (Signed)
 Appointment has been scheduled for 10/14 at 10:15am,called and spoke to daughter and confirmed per dpr,NFN

## 2024-04-20 NOTE — Telephone Encounter (Signed)
 Please advise on referral request

## 2024-04-26 ENCOUNTER — Encounter: Payer: Self-pay | Admitting: Internal Medicine

## 2024-04-26 DIAGNOSIS — I519 Heart disease, unspecified: Secondary | ICD-10-CM

## 2024-04-28 NOTE — Telephone Encounter (Signed)
 They can start with DR Robert Jerzy Krasowski, MD

## 2024-05-01 ENCOUNTER — Other Ambulatory Visit: Payer: Self-pay | Admitting: *Deleted

## 2024-05-01 ENCOUNTER — Telehealth: Payer: Self-pay | Admitting: *Deleted

## 2024-05-01 DIAGNOSIS — I519 Heart disease, unspecified: Secondary | ICD-10-CM

## 2024-05-01 DIAGNOSIS — R931 Abnormal findings on diagnostic imaging of heart and coronary circulation: Secondary | ICD-10-CM

## 2024-05-01 NOTE — Telephone Encounter (Signed)
 Pt's daughter Warren calling to advise she needs a referral to this cardiologist.  Warren slim ths is very urgent as pt's heart functioning 40%.

## 2024-05-01 NOTE — Telephone Encounter (Signed)
 Received call from patient's daughter, Jodi Curtis regarding cardiology referral.  Advised I would placed the referral.  Referral placed with physician Dr. Geronimo mentioned in a note, Dr. Lamar Larayne Fitch, MD.   Advised they would received a call regarding an appointment.  She verbalized understanding.  Nothing further needed.

## 2024-05-02 NOTE — Telephone Encounter (Signed)
 Referral to recommended cardio placed

## 2024-05-15 DIAGNOSIS — Z8249 Family history of ischemic heart disease and other diseases of the circulatory system: Secondary | ICD-10-CM | POA: Insufficient documentation

## 2024-05-15 DIAGNOSIS — J449 Chronic obstructive pulmonary disease, unspecified: Secondary | ICD-10-CM | POA: Insufficient documentation

## 2024-05-15 DIAGNOSIS — I10 Essential (primary) hypertension: Secondary | ICD-10-CM | POA: Insufficient documentation

## 2024-05-15 DIAGNOSIS — Z87891 Personal history of nicotine dependence: Secondary | ICD-10-CM | POA: Insufficient documentation

## 2024-05-16 ENCOUNTER — Encounter: Payer: Self-pay | Admitting: Cardiology

## 2024-05-16 ENCOUNTER — Ambulatory Visit: Attending: Cardiology | Admitting: Cardiology

## 2024-05-16 VITALS — BP 100/80 | HR 65 | Ht <= 58 in | Wt 89.2 lb

## 2024-05-16 DIAGNOSIS — R9431 Abnormal electrocardiogram [ECG] [EKG]: Secondary | ICD-10-CM

## 2024-05-16 DIAGNOSIS — I519 Heart disease, unspecified: Secondary | ICD-10-CM | POA: Diagnosis not present

## 2024-05-16 DIAGNOSIS — I420 Dilated cardiomyopathy: Secondary | ICD-10-CM

## 2024-05-16 DIAGNOSIS — J9611 Chronic respiratory failure with hypoxia: Secondary | ICD-10-CM

## 2024-05-16 DIAGNOSIS — R072 Precordial pain: Secondary | ICD-10-CM

## 2024-05-16 DIAGNOSIS — R0789 Other chest pain: Secondary | ICD-10-CM | POA: Diagnosis not present

## 2024-05-16 MED ORDER — METOPROLOL TARTRATE 100 MG PO TABS: ORAL_TABLET | ORAL | 0 refills | Status: DC

## 2024-05-16 MED ORDER — ASPIRIN 81 MG PO TBEC: 81.0000 mg | DELAYED_RELEASE_TABLET | Freq: Every day | ORAL | Status: AC

## 2024-05-16 NOTE — Patient Instructions (Addendum)
 Medication Instructions:  STOP: Metoprolol   START: Aspirin  81mg  1 tablet daily  TAKE: Metoprolol  100mg  1 tablet 2 hours prior to CT Scan   Lab Work: None Ordered If you have labs (blood work) drawn today and your tests are completely normal, you will receive your results only by: MyChart Message (if you have MyChart) OR A paper copy in the mail If you have any lab test that is abnormal or we need to change your treatment, we will call you to review the results.   Testing/Procedures:  Your cardiac CT will be scheduled at one of the below locations:   [facility] [address] [place], [place] [address] [phone]  Please follow these instructions carefully (unless otherwise directed):    On the Night Before the Test: Be sure to Drink plenty of water. Do not consume any caffeinated/decaffeinated beverages or chocolate 12 hours prior to your test. Do not take any antihistamines 12 hours prior to your test.   On the Day of the Test: Drink plenty of water until 1 hour prior to the test. Do not eat any food 4 hours prior to the test. You may take your regular medications prior to the test.  Take metoprolol  (Lopressor ) two hours prior to test. FEMALES- please wear underwire-free bra if available, avoid dresses & tight clothing       After the Test: Drink plenty of water. After receiving IV contrast, you may experience a mild flushed feeling. This is normal. On occasion, you may experience a mild rash up to 24 hours after the test. This is not dangerous. If this occurs, you can take Benadryl  25 mg and increase your fluid intake. If you experience trouble breathing, this can be serious. If it is severe call 911 IMMEDIATELY. If it is mild, please call our office. If you take any of these medications: Glipizide/Metformin, Avandament, Glucavance, please do not take 48 hours after completing test unless otherwise instructed.  We will call to schedule your test 2-4 weeks  out understanding that some insurance companies will need an authorization prior to the service being performed.   For non-scheduling related questions, please contact the cardiac imaging nurse navigator should you have any questions/concerns: Camie Shutter, Cardiac Imaging Nurse Navigator Chantal Requena, Cardiac Imaging Nurse Navigator [facility] Direct Office Dial: [phone]   For scheduling needs, including cancellations and rescheduling, please call [place], [phone].    Follow-Up: At [facility], you and your health needs are our priority.  As part of our continuing mission to provide you with exceptional heart care, we have created designated Provider Care Teams.  These Care Teams include your primary Cardiologist (physician) and Advanced Practice Providers (APPs -  Physician Assistants and Nurse Practitioners) who all work together to provide you with the care you need, when you need it.  We recommend signing up for the patient portal called MyChart.  Sign up information is provided on this After Visit Summary.  MyChart is used to connect with patients for Virtual Visits (Telemedicine).  Patients are able to view lab/test results, encounter notes, upcoming appointments, etc.  Non-urgent messages can be sent to your provider as well.   To learn more about what you can do with MyChart, go to [email or web address].    Your next appointment:   2 month(s)  The format for your next appointment:   In Person  Provider:   Lamar Fitch, MD    Other Instructions NA

## 2024-05-16 NOTE — Progress Notes (Signed)
 Cardiology Consultation:    Date:  05/16/2024   ID:  Jodi Curtis, DOB October 13, 1955, MRN [id number]  PCP:  Silvano Angeline FALCON, NP  Cardiologist:  Lamar Fitch, MD   Referring MD: Silvano Angeline FALCON, NP   No chief complaint on file.   History of Present Illness:    Jodi Curtis is a 68 y.o. female who is being seen today for the evaluation of abnormal echo, chest pain at the request of Silvano Angeline FALCON, NP.  Past medical history significant for lifelong smoking she quit 3 years ago however does have advanced COPD required oxygen.  She has been follow-up blood cultures excellently by pulmonary team.  She does have abnormal EKG raising suspicion for anterior wall as well as lateral wall MI, recently she ended up having echocardiogram done which showed mildly reduced left ventricular ejection fraction 45%.  In 2022 she was evaluated by Dr. Raylene for similar symptomatology at that time her echocardiogram showed preserved left ventricle ejection fraction, stress test was normal which was normal.  She comes today with her daughter who participated in decision making.  She says she is doing fine she cannot walk much because of shortness of breath and fatigue.  She spent majority of time sleeping her daughter Jodi Curtis jokes that she need to wake up every time to eat and then she goes back to sleep.  However on my questioning if she can go to store she said she can but does not want to.  She does have advanced COPD, essential hypertension, apparently she is taking atenolol  as well as amlodipine however she tells me that she did discontinue amlodipine long time ago.  She does have family history of premature coronary artery disease.  All of her family members were smokers many of them at that either because of heart condition all cancer.  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   BMI 26.0-26.9,adult 10/25/2015   Body mass index (BMI) 19.9 or less, adult 09/09/2023   Body mass index [BMI] 19.9 or less, adultBody mass index [BMI] 19.9 or less, adult; Start Date : 05/02/2024Body mass index [BMI] 19.9 or less, adult; Start Date : 04/02/2024Body mass index [BMI] 19.9 or less, adult; Start Date : 03/05/2024Body mass index [BMI] 19.9 or less, adult; Start Date : 02/26/2024Body mass index [BMI] 19.9 or less, adult; Start Date : 10/26/2023Body mass index [BMI] 1   Body mass index (BMI) 20.0-20.9, adult 08/24/2022   Body mass index [BMI] 20.0-20.9, adultBody mass index [BMI] 20.0-20.9, adult; Start Date : 04/03/2023Body mass index [BMI] 20.0-20.9, adult; Start Date : 01/04/2023Body mass index [BMI] 20.0-20.9, adult; Start Date : 09/10/2021     CAP (community acquired pneumonia) 11/24/2022   Centrilobular emphysema (HCC) 05/22/2021   Chest CT 01/21/2021     Chest pain 01/20/2021   Chest pain, unspecifiedChest pain, unspecified; Start Date : 02/14/2019     Chest pressure 02/03/2021   Chronic pain syndrome 10/25/2015   Chronic respiratory  failure with hypoxia (HCC) 05/22/2021   6-minute walk test 02/10/2021 noted oxygen desaturation     Contact with and (suspected) exposure to other viral communicable diseases 12/29/2022   Contact w and exposure to oth viral communicable diseasesContact w and exposure to oth viral communicable diseases; Start Date : 02/26/2024Contact w and exposure to oth viral communicable diseases; Start Date : 10/01/2021     COPD (chronic obstructive pulmonary  disease) (HCC)    COPD with acute exacerbation (HCC) 03/14/2019   Quit all smoking 12/2018   - alpha one AT screen 03/14/2019   SS   Level 99   - 03/14/2019    changed trelegy to symb/spiriva  smi   - 05/09/2019  After extensive coaching inhaler device,  effectiveness =    75% hfa and smi      COVID-19 10/01/2021   COVID-19COVID-19; Start Date : 09/14/2021COVID-19; Start Date : 12/30/2020COVID-19; Start Date : 09/11/2019     Dyspnea 01/20/2021   Shortness of breath     Elevated BP without diagnosis of hypertension 10/25/2015   Encounter for screening for infectious and parasitic diseases, unspecified 03/19/2020   Encounter for screening for other viral diseasesEncounter for screening for other viral diseases; Start Date : 02/26/2024Encounter for screening for other viral diseases; Start Date : 01/04/2023Encounter for screening for other viral diseases; Start Date : 12/14/2022Encounter for screening for other viral diseases; Start Date : 12/30/2020Encounter for screening for other viral diseases; Start   Exercise hypoxemia 03/14/2019   03/14/2019   desats Walking RA x one lap =  approx 250 ft - stopped due to  Sob/ desats to 84% at moderate pace      Family history of early CAD 05/15/2024   Fatigue 04/14/2021   Other fatigue     Former smoker 03/11/2020   LDCT chest ordered per Angeline York:   12/22/19 neg      Gastroesophageal reflux disease without esophagitis 02/10/2024   Hardening of the aorta (main artery of the heart) (HCC) 02/10/2024   HTN (hypertension) 11/24/2022   Hypertension    Idiopathic scoliosis 10/25/2015   Impacted cerumen 09/11/2010   IMPACTED CERUMEN     Impaired cognition 02/10/2024   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/03/2014   Dermatitis, unspecified     Localized enlarged lymph nodes 10/17/2015   Localized enlarged lymph nodes     Long term current use of therapeutic drug 04/04/2019   Other long term (current) drug therapy     Low back pain 02/11/2024   Lung field  abnormal 10/01/2015   Other nonspecific abnormal finding of lung field     Mucopurulent chronic bronchitis (HCC) 01/21/2023   Respiratory assist vest; azithromycin  M, W, F     Neck pain 04/08/2022   CervicalgiaCervicalgia; Start Date : 05/05/2021     Nicotine dependence 02/10/2024   Obstructive sleep apnea syndrome 02/10/2024   Onychomycosis due to dermatophyte 09/09/2023   Tinea unguiumTinea unguium; Start Date : 05/03/2023     Osteoarthritis of carpometacarpal [facility]) joint of thumb 02/10/2024   Oxygen dependent 02/10/2024   Pain in finger of left hand 12/14/2017   Pain in left finger(s)     Pain in left arm 04/08/2022   Pain in left arm     Pain in right hand 04/08/2022   Pain in right hand     Pain of left lower extremity 09/09/2016   Pain in left leg     Pain of toe of left foot 12/14/2017   Pain in left toe(s)  Paresthesia 04/08/2022   Paresthesia of skin     Persistent cough 12/23/2015   Cough     Personal history of nicotine dependence 01/20/2021   Personal history of nicotine dependence     Posterior rhinorrhea 03/19/2020   Postnasal dripPostnasal drip; Start Date : 12/14/2017     Prediabetes 04/14/2021   Prediabetes     Primary osteoarthritis involving multiple joints 10/25/2015   Radiculopathy due to lumbar intervertebral disc disorder 04/07/2019   Intervertebral disc disorders w radiculopathy, lumbar region     Recurrent major depression (HCC) 02/10/2024   Solitary pulmonary nodule 10/17/2015   Solitary pulmonary nodule     Spondylitis (HCC) 02/10/2024   Stopped smoking with greater than 30 pack year history 05/15/2024   Urinary incontinence 02/10/2024   Vitamin D deficiency 02/10/2024    Past Surgical History:  Procedure Laterality Date   CHOLECYSTECTOMY     TUBAL LIGATION      Current Medications: Current Meds  Medication Sig   Ensifentrine  (OHTUVAYRE  IN) Take 3 mg by nebulization daily.     Allergies:   Sulfa antibiotics, Codeine, and  Penicillins   Social History   Socioeconomic History   Marital status: Married    Spouse name: Not on file   Number of children: Not on file   Years of education: Not on file   Highest education level: Not on file  Occupational History   Not on file  Tobacco Use   Smoking status: Former    Current packs/day: 0.00    Average packs/day: 1 pack/day for 30.0 years (30.0 ttl pk-yrs)    Types: Cigarettes    Start date: 01/27/1987    Quit date: 01/26/2017    Years since quitting: 7.3   Smokeless tobacco: Never  Vaping Use   Vaping status: Former   Start date: 01/26/2017   Quit date: 11/27/2018  Substance and Sexual Activity   Alcohol use: Never   Drug use: Never   Sexual activity: Never  Other Topics Concern   Not on file  Social History Narrative   Not on file   Social Drivers of Health   Financial Resource Strain: Not on file  Food Insecurity: Low Risk  (07/21/2023)   Received from [facility]   Hunger Vital Sign    Within the past 12 months, you worried that your food would run out before you got money to buy more: Never true    Within the past 12 months, the food you bought just didn't last and you didn't have money to get more. : Never true  Transportation Needs: No Transportation Needs (07/21/2023)   Received from [employer]    In the past 12 months, has lack of reliable transportation kept you from medical appointments, meetings, work or from getting things needed for daily living? : No  Physical Activity: Not on file  Stress: Not on file  Social Connections: Not on file     Family History: The patient's family history is not on file. ROS:   Please see the history of present illness.    All 14 point review of systems negative except as described per history of present illness.  EKGs/Labs/Other Studies Reviewed:    The following studies were reviewed today:   EKG:      Recent Labs: 12/23/2023: ALT 17; BUN 11; Creatinine, Ser 0.76; Potassium  4.0; Sodium 141 03/14/2024: Hemoglobin 12.6; Platelets 297.0; Pro B Natriuretic peptide (BNP) 34.0  Recent Lipid Panel No results found  for: CHOL, TRIG, HDL, CHOLHDL, VLDL, LDLCALC, LDLDIRECT  Physical Exam:    VS:  BP 100/80   Pulse 65   Ht 4' 9 (1.448 m)   Wt 89 lb 3.2 oz (40.5 kg)   SpO2 98%   BMI 19.30 kg/m     Wt Readings from Last 3 Encounters:  05/16/24 89 lb 3.2 oz (40.5 kg)  04/19/24 91 lb (41.3 kg)  03/14/24 90 lb 6.4 oz (41 kg)     GEN:  Well nourished, well developed in no acute distress HEENT: Normal NECK: No JVD; No carotid bruits LYMPHATICS: No lymphadenopathy CARDIAC: RRR, no murmurs, no rubs, no gallops RESPIRATORY: Poor air entry bilaterally with few rhonchi ABDOMEN: Soft, non-tender, non-distended MUSCULOSKELETAL:  No edema; No deformity  SKIN: Warm and dry NEUROLOGIC:  Alert and oriented x 3 PSYCHIATRIC:  Normal affect   ASSESSMENT:    1. Chronic systolic dysfunction of left ventricle   2. Atypical chest pain   3. Abnormal EKG   4. Dilated cardiomyopathy (HCC) ejection fraction 45% 2025   5. Chronic respiratory failure with hypoxia (HCC)    PLAN:    In order of problems listed above:  Systolic congestive heart failure.  Echocardiogram revealed ejection fraction of 45% there was no description of segmental wall motion abnormality, global longitudinal strain to 21.1%, ejection fraction 51%.  In terms of identification of etiology of this phenomenon ischemia need to be ruled out.  I talked at length with her about how to do this procedure she is reluctant to have a stress test but I think there is some value in doing coronary CT angio.  I described procedure for her and will proceed. I suspect coronary artery disease as etiology of her cardiomyopathy, coronary CT angio will be done.  In terms of medication for cardiomyopathy difficult scenario her blood pressure is very soft.  I will discontinue her atenolol  since is not approved for  cardiomyopathy, in the future we will try to put her on ACE inhibitor or ARB or Entresto plus beta-blocker if she is able to tolerate which I doubt. Abnormal EKG raising suspicion for anterior as well as lateral MI.  Again coronary CT angio will be done. COPD advanced, she uses oxygen all the time. Dyslipidemia: I do not have any recent fasting lipid profile we will do the test   Medication Adjustments/Labs and Tests Ordered: Current medicines are reviewed at length with the patient today.  Concerns regarding medicines are outlined above.  Orders Placed This Encounter  Procedures   EKG 12-Lead   No orders of the defined types were placed in this encounter.   Signed, Lamar DOROTHA Fitch, MD, [facility]. 05/16/2024 3:30 PM    [facility]

## 2024-05-19 ENCOUNTER — Telehealth (HOSPITAL_COMMUNITY): Payer: Self-pay | Admitting: *Deleted

## 2024-05-19 NOTE — Telephone Encounter (Signed)
 Reaching out to patient to offer assistance regarding upcoming cardiac imaging study; pt verbalizes understanding of appt date/time, parking situation and where to check in, pre-test NPO status and medications ordered, and verified current allergies; name and call back number provided for further questions should they arise Sid Seats RN Navigator Cardiac Imaging [facility] [phone] office [phone] cell

## 2024-05-22 ENCOUNTER — Ambulatory Visit (HOSPITAL_COMMUNITY)
Admission: RE | Admit: 2024-05-22 | Discharge: 2024-05-22 | Disposition: A | Discharge status: Home / Self Care | Source: Ambulatory Visit | Attending: Cardiology | Admitting: Cardiology

## 2024-05-22 DIAGNOSIS — R072 Precordial pain: Secondary | ICD-10-CM | POA: Diagnosis not present

## 2024-05-22 DIAGNOSIS — R079 Chest pain, unspecified: Secondary | ICD-10-CM | POA: Insufficient documentation

## 2024-05-22 DIAGNOSIS — I700 Atherosclerosis of aorta: Secondary | ICD-10-CM | POA: Insufficient documentation

## 2024-05-22 MED ORDER — NITROGLYCERIN 0.4 MG SL SUBL
0.8000 mg | SUBLINGUAL_TABLET | Freq: Once | SUBLINGUAL | Status: AC
  Administered 2024-05-22: 0.8 mg via SUBLINGUAL

## 2024-05-22 MED ORDER — IOHEXOL 350 MG/ML SOLN
100.0000 mL | Freq: Once | INTRAVENOUS | Status: AC | PRN
  Administered 2024-05-22: 100 mL via INTRAVENOUS

## 2024-05-24 ENCOUNTER — Ambulatory Visit: Payer: Self-pay | Admitting: Cardiology

## 2024-07-06 ENCOUNTER — Other Ambulatory Visit: Payer: Self-pay | Admitting: Internal Medicine

## 2024-07-06 MED ORDER — VENTOLIN HFA 108 (90 BASE) MCG/ACT IN AERS: 2.0000 | INHALATION_SPRAY | RESPIRATORY_TRACT | 3 refills | Status: DC | PRN

## 2024-07-11 ENCOUNTER — Ambulatory Visit: Admitting: Internal Medicine

## 2024-07-11 ENCOUNTER — Encounter: Payer: Self-pay | Admitting: Internal Medicine

## 2024-07-11 VITALS — BP 116/72 | HR 84 | Temp 97.7°F | Ht <= 58 in | Wt 87.6 lb

## 2024-07-11 DIAGNOSIS — R911 Solitary pulmonary nodule: Secondary | ICD-10-CM | POA: Diagnosis not present

## 2024-07-11 DIAGNOSIS — Z7185 Encounter for immunization safety counseling: Secondary | ICD-10-CM | POA: Diagnosis not present

## 2024-07-11 DIAGNOSIS — J441 Chronic obstructive pulmonary disease with (acute) exacerbation: Secondary | ICD-10-CM | POA: Diagnosis not present

## 2024-07-11 DIAGNOSIS — J449 Chronic obstructive pulmonary disease, unspecified: Secondary | ICD-10-CM

## 2024-07-11 MED ORDER — PREDNISONE 10 MG PO TABS: ORAL_TABLET | ORAL | 0 refills | Status: DC

## 2024-07-11 MED ORDER — DOXYCYCLINE HYCLATE 100 MG PO TABS: 100.0000 mg | ORAL_TABLET | Freq: Two times a day (BID) | ORAL | 0 refills | Status: DC

## 2024-07-11 MED ORDER — ALBUTEROL SULFATE HFA 108 (90 BASE) MCG/ACT IN AERS: 2.0000 | INHALATION_SPRAY | Freq: Four times a day (QID) | RESPIRATORY_TRACT | 6 refills | Status: DC | PRN

## 2024-07-11 NOTE — Progress Notes (Signed)
 OV 04/19/2024  Subjective:  Patient ID: Jodi Curtis, female , DOB: 04-Feb-1956 , age 68 y.o. , MRN: [id number] , ADDRESS: [address] [place] [address] PCP Jodi Angeline FALCON, NP Patient Care Team: Jodi Angeline FALCON, NP as PCP - General  This Provider for this visit: Treatment Team:  Attending Provider: Geronimo Amel, MD    04/19/2024 -   Chief Complaint  Patient presents with   Medical Management of Chronic Issues    Chronic Respiratory Failure. Breathing has been stable. She was unable to complete PFT today. She has a non prod cough.      HPI Jodi Curtis 68 y.o. -COPD follow-up.  Here for results of workup.  Presents with daughter Warren and also granddaughter Ashlyn.  Patient is the widow of my late patient Mr. Christopher Hink.  No interim complaints.  She continues on Breztri , continues on oxygen schedule. She has gotten OTHUVAYRE -first doses to start today.  He just arrived by mail.  There is no co-pay for her.  Her blood eosinophils are normal therefore she does not qualify for Dupixent.  She is reluctant to do any BiPAP at night.  Alpha-1 antitrypsin is SS and this typically does not qualify for alpha-1 replacement infusion although I did counsel the daughter and granddaughter to get this checked.  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  She is not interested in lung transplant as a care option.  CT scan shows emphysema no pulmonary fibrosis but she does have an new left lung nodule.  She is willing to get a follow-up CT  Echocardiogram shows ejection fraction 45%.  She says she is seeing cardiology at [facility] but details are not known.  She is willing to see them again.  Social   - Granddaughter Sabra is a Chief Strategy Officer at [employer] and is going to study exercise science. GLENWOOD Sabra was 34-19 years old when grandfather Mr. Marcedes Tech my patient passed away    IMPRESSION:  OV 03/14/2024 -new consult.  Last seen by Dr.  Ozell America in [place] greater than 3 years ago  Subjective:  Patient ID: Jodi Curtis, female , DOB: Mar 28, 1956 , age 12 y.o. , MRN: [id number] , ADDRESS: [address] [place] [address] PCP Jodi Angeline FALCON, NP Patient Care Team: Jodi Angeline FALCON, NP as PCP - General  This Provider for this visit: Treatment Team:  Attending Provider: Geronimo Amel, MD    03/14/2024 -   Chief Complaint  Patient presents with   Pulmonary Consult    Self referral for COPD.      HPI [name] 68 y.o. -is a widow of my patient Mikaiya Tramble who died in 07/25/17.  He suffered from advanced COPD and cor pulmonale.  She now presents with her daughter Warren.  Jamie used to come with her dad on all the office visits back in the old office.  Warren reports that after patient's husband/her dad passed away in 07-25-2017 patient started having respiratory complaints but was in denial for a while.  Then in 2019-07-26 she saw both Dr. Ozell America and [facility].  At the time alpha-1 was normal levels but SS.  Gold stage IV COPD was diagnosed based on FEV1 0.6 L / 28%.  However because of COVID patient did not follow-up.  She subsequently established with an room air at [facility] and was attending visits there.  External record review  shows this.  She has had admissions from COPD exacerbation.  She has been maintained on Breztri .  Some 3 years ago she ended up on oxygen 24/7.  She was on 2 L but apparently in the last year she has been on 3 L at rest 4 L with exertion.  On room air today she was pulse ox 81%.  Daughter says this is kind of normal for her.  She suffers from chronic cough and shortness of breath.  I could not find an echo in the system.  Last CT scan of the chest was in 2024.  They are transferring care to me because of meeting care of her dad in the past.  Daughter is interested in new potential treatments.  Smoking history makes eligible for lung cancer screening  No  echocardiogram noticed  2024 ABG shows hypercarbia patient tried BiPAP but did not tolerate  Alpha-1 is SS but because of COVID there was lost opportunity to discuss this.  She does not like albuterol  HFA.  Daughter thinks this does not work.  Asking for Ventolin .    PUSLE O X 81% Ra    Other social history - Daughter Warren works now in a Theme park manager as a Engineer, manufacturing.  She has been doing there for 3 years.  This in [place] - Granddaughter is attending [employer] is a senior - Darden is going to become an Designer, multimedia 1. Multiple new irregular consolidations in the lingula. New irregular opacity in the dependent left lower lobe. Findings are consistent with multifocal infection or aspiration. 2. Severe emphysema and diffuse bilateral bronchial wall thickening. 3. Small pulmonary nodules unchanged, some of which are calcified and consistent with benign sequelae of prior infection or inflammation. Consider ongoing annual low-dose CT lung cancer screening if indicated by patient age, smoking history, and/or other risk factors for lung cancer.   Aortic Atherosclerosis (ICD10-I70.0) and Emphysema (ICD10-J43.9).     Electronically Signed   By: Marolyn JONETTA Jaksch M.D.   On: 04/11/2024 10:15   LAB RESULTS last 96 hours No results found.   IMPRESSIONS  - ECHO July 2025    1. Dyskinetic med and distal septum.. Left ventricular ejection fraction,  by estimation, is 45 to 50%. The left ventricle has mildly decreased  function. The left ventricle has no regional wall motion abnormalities.  Left ventricular diastolic parameters  are consistent with Grade I diastolic dysfunction (impaired relaxation).  The average left ventricular global longitudinal strain is -20.1 %. The  global longitudinal strain is normal.   2. Right ventricular systolic function is normal. The right ventricular  size is normal. There is mildly elevated pulmonary artery systolic   pressure.   3. The mitral valve is normal in structure. No evidence of mitral valve  regurgitation. No evidence of mitral stenosis.   4. The aortic valve is normal in structure. Aortic valve regurgitation is  mild. No aortic stenosis is present.   5. The inferior vena cava is normal in size with greater than 50%  respiratory variability, suggesting right atrial pressure of 3 mmHg.       OV 07/11/2024  Subjective:  Patient ID: Jodi Curtis, female , DOB: July 06, 1956 , age 65 y.o. , MRN: [id number] , ADDRESS: [address] [place] [address] PCP Jodi Angeline FALCON, NP Patient Care Team: Jodi Angeline FALCON, NP as PCP - General  This Provider for this visit: Treatment Team:  Attending Provider: Geronimo Amel, MD  07/11/2024 -   Chief Complaint  Patient presents with   COPD    F/U Pt states breathing has been okay since LOV SOB occurs w/ exertion Prod cough ( phlegm green)  Pt on 3L of o2     HPI Jodi Curtis 68 y.o. -presents for follow-up.  At this time accompanied by her grandson Massie.  He is flying as a Hospital doctor.  Since her last office visit Interim Health status: No new complaints No new medical problems. No new surgeries. No ER visits. No Urgent care visits. No changes to medications.  In terms of systolic heart failure she did see Dr. MARLA and did she have a fraction CT.  At this point in time she is on triple inhaler therapy and also OtHVYARE  Of note she tells me for the last few to several days she is having increased crud: with cough and green sputum.  This is a new finding.   CT scan lung cancer screening/nodules: She had a high-res CT in July 2025 there were some lingular infiltrates but August 2025 CT chest cardiac did show that and was felt to be atelectasis and stable.  Person visualized this and agree       HRCT July 2025  IMPRESSION: 1. Multiple new irregular consolidations in the lingula. New irregular opacity in the  dependent left lower lobe. Findings are consistent with multifocal infection or aspiration. 2. Severe emphysema and diffuse bilateral bronchial wall thickening. 3. Small pulmonary nodules unchanged, some of which are calcified and consistent with benign sequelae of prior infection or inflammation. Consider ongoing annual low-dose CT lung cancer screening if indicated by patient age, smoking history, and/or other risk factors for lung cancer.   Aortic Atherosclerosis (ICD10-I70.0) and Emphysema (ICD10-J43.9).     Electronically Signed   By: Marolyn JONETTA Jaksch M.D.   On: 04/11/2024 10:15     CT Chest data from date: 05/22/24  - personally visualized and independently interpreted : yes - my findings are: as below ADDENDUM REPORT: 05/24/2024 19:47   EXAM: OVER-READ INTERPRETATION  CT CHEST   The following report is an over-read performed by radiologist Dr. Fonda Mom [facility] on 05/24/2024. This over-read does not include interpretation of cardiac or coronary anatomy or pathology. The coronary CTA interpretation by the cardiologist is attached.   COMPARISON:  04/07/2024.   FINDINGS: Cardiovascular:  See findings discussed in the body of the report.   Mediastinum/Nodes: No suspicious adenopathy identified. Imaged mediastinal structures are unremarkable.   Lungs/Pleura: Subsegmental atelectasis or scarring identified in the right middle lobe, lingula and left base. No pleural effusion or pneumothorax.   Upper Abdomen: No acute abnormality.   Musculoskeletal: No chest wall abnormality. No acute osseous findings.   IMPRESSION: Subsegmental atelectasis or scarring in the lung bases.     Electronically Signed   By: Fonda Field M.D.   On: 05/24/2024 19:47    PFT     Latest Ref Rng & Units 10/11/2019    2:58 PM  PFT Results  FVC-Pre L 1.25   FVC-Predicted Pre % 49   FVC-Post L 1.28   FVC-Predicted Post % 50   Pre FEV1/FVC % % 44   Post  FEV1/FCV % % 45   FEV1-Pre L 0.55   FEV1-Predicted Pre % 28   FEV1-Post L 0.58   DLCO uncorrected ml/min/mmHg 4.79   DLCO UNC% % 29   DLVA Predicted % 41     Immunization History  Administered Date(s) Administered  INFLUENZA, HIGH DOSE SEASONAL PF 05/23/2024   Influenza,inj,Quad PF,6+ Mos 08/12/2019   Pneumococcal Conjugate-13 05/21/2014   Pneumococcal Polysaccharide-23 06/21/2017   Zoster Recombinant(Shingrix) 05/03/2023, 11/15/2023    Allergies  Allergen Reactions   Sulfa Antibiotics Anaphylaxis   Codeine Itching   Penicillins Other (See Comments)    Internal bleeding, welts       LAB RESULTS last 96 hours No results found.       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), BMI 26.0-26.9,adult (10/25/2015), Body mass index (BMI) 19.9 or less, adult (09/09/2023), Body mass index (BMI) 20.0-20.9, adult (08/24/2022), CAP (community acquired pneumonia) (11/24/2022), Centrilobular emphysema (HCC) (05/22/2021), Chest pain (01/20/2021), Chest pressure (02/03/2021), Chronic pain syndrome (10/25/2015), Chronic respiratory failure with hypoxia (HCC) (05/22/2021), Contact with and (suspected) exposure to other viral communicable diseases (12/29/2022), COPD (chronic obstructive pulmonary disease) (HCC), COPD with acute exacerbation (HCC) (03/14/2019), COVID-19 (10/01/2021), Dyspnea (01/20/2021), Elevated BP without diagnosis of hypertension (10/25/2015), Encounter for screening for infectious and parasitic diseases, unspecified (03/19/2020), Exercise hypoxemia (03/14/2019), Family history of early CAD (05/15/2024), Fatigue (04/14/2021), Former smoker (03/11/2020), Gastroesophageal reflux disease without  esophagitis (02/10/2024), Hardening of the aorta (main artery of the heart) (02/10/2024), HTN (hypertension) (11/24/2022), Hypertension, Idiopathic scoliosis (10/25/2015), Impacted cerumen (09/11/2010), Impaired cognition (02/10/2024), Inflammatory dermatosis (08/03/2014), Localized enlarged lymph nodes (10/17/2015), Long term current use of therapeutic drug (04/04/2019), Low back pain (02/11/2024), Lung field abnormal (10/01/2015), Mucopurulent chronic bronchitis (HCC) (01/21/2023), Neck pain (04/08/2022), Nicotine dependence (02/10/2024), Obstructive sleep apnea syndrome (02/10/2024), Onychomycosis due to dermatophyte (09/09/2023), Osteoarthritis of carpometacarpal (CMC) joint of thumb (02/10/2024), Oxygen dependent (02/10/2024), Pain in finger of left hand (12/14/2017), Pain in left arm (04/08/2022), Pain in right hand (04/08/2022), Pain of left lower extremity (09/09/2016), Pain of toe of left foot (12/14/2017), Paresthesia (04/08/2022), Persistent cough (12/23/2015), Personal history of nicotine dependence (01/20/2021), Posterior rhinorrhea (03/19/2020), Prediabetes (04/14/2021), Primary osteoarthritis involving multiple joints (10/25/2015), Radiculopathy due to lumbar intervertebral disc disorder (04/07/2019), Recurrent major depression (02/10/2024), Solitary pulmonary nodule (10/17/2015), Spondylitis (02/10/2024), Stopped smoking with greater than 30 pack year history (05/15/2024), Urinary incontinence (02/10/2024), and Vitamin D deficiency (02/10/2024).   reports that she quit smoking about 7 years ago. Her smoking use included cigarettes. She started smoking about 37 years ago. She has a 30 pack-year smoking history. She has never used smokeless tobacco.  Past Surgical History:  Procedure Laterality Date   CHOLECYSTECTOMY     TUBAL LIGATION      Allergies  Allergen Reactions   Sulfa Antibiotics Anaphylaxis   Codeine Itching   Penicillins Other (See Comments)    Internal bleeding, welts      Immunization History  Administered Date(s) Administered   INFLUENZA, HIGH DOSE SEASONAL PF 05/23/2024   Influenza,inj,Quad PF,6+ Mos 08/12/2019   Pneumococcal Conjugate-13 05/21/2014   Pneumococcal Polysaccharide-23 06/21/2017   Zoster Recombinant(Shingrix) 05/03/2023, 11/15/2023    History reviewed. No pertinent family history.   Current Outpatient Medications:    albuterol  (VENTOLIN  HFA) 108 (90 Base) MCG/ACT inhaler, Inhale 2 puffs into the lungs every 6 (six) hours as needed for wheezing or shortness of breath., Disp: 8 g, Rfl: 2   albuterol  (VENTOLIN  HFA) 108 (90 Base) MCG/ACT inhaler, Inhale 2 puffs into the lungs every 6 (six) hours as needed for wheezing or shortness of breath., Disp: 8 g, Rfl: 6  aspirin  EC 81 MG tablet, Take 1 tablet (81 mg total) by mouth daily. Swallow whole., Disp: , Rfl:    BREZTRI  AEROSPHERE 160-9-4.8 MCG/ACT AERO, Inhale 2 puffs into the lungs 2 (two) times daily., Disp: 10.7 g, Rfl: 6   busPIRone  (BUSPAR ) 10 MG tablet, Take 1 tablet by mouth 2 (two) times daily., Disp: , Rfl:    citalopram  (CELEXA ) 40 MG tablet, Take 40 mg by mouth daily., Disp: , Rfl:    doxycycline (VIBRA-TABS) 100 MG tablet, Take 1 tablet (100 mg total) by mouth 2 (two) times daily., Disp: 10 tablet, Rfl: 0   Ensifentrine  (OHTUVAYRE  IN), Take 3 mg by nebulization daily., Disp: , Rfl:    meloxicam (MOBIC) 15 MG tablet, Take 15 mg by mouth daily., Disp: , Rfl:    metoprolol  tartrate (LOPRESSOR ) 100 MG tablet, Take one tablet 2 hours before cardiac CT for heart greater than 55, Disp: 1 tablet, Rfl: 0   predniSONE  (DELTASONE ) 10 MG tablet, 4 x 1 day, 3 x 1 day, 2 x 1 day, 1 x 1 day, 1/2 x 1 day then stop, Disp: 11 tablet, Rfl: 0   VENTOLIN  HFA 108 (90 Base) MCG/ACT inhaler, Inhale 2 puffs into the lungs every 4 (four) hours as needed for wheezing or shortness of breath., Disp: 1 each, Rfl: 3   amLODipine (NORVASC) 10 MG tablet, Take 10 mg by mouth daily. (Patient not taking: Reported  on 07/11/2024), Disp: , Rfl:       Objective:   Vitals:   07/11/24 0959  BP: 116/72  Pulse: 84  Temp: 97.7 F (36.5 C)  TempSrc: Oral  SpO2: 96%  Weight: 87 lb 9.6 oz (39.7 kg)  Height: 4' 9 (1.448 m)    Estimated body mass index is 18.96 kg/m as calculated from the following:   Height as of this encounter: 4' 9 (1.448 m).   Weight as of this encounter: 87 lb 9.6 oz (39.7 kg).  @WEIGHTCHANGE @  Filed Weights   07/11/24 0959  Weight: 87 lb 9.6 oz (39.7 kg)     Physical Exam   General: No distress. Thin. Looks well O2 at rest: yes Cane present: no Sitting in wheel chair: no Frail: yes Obese: no Neuro: Alert and Oriented x 3. GCS 15. Speech normal Psych: Pleasant Resp:  Barrel Chest - yes.  Wheeze - no, Crackles - no, No overt respiratory distress CVS: Normal heart sounds. Murmurs - no Ext: Stigmata of Connective Tissue Disease - no HEENT: Normal upper airway. PEERL +. No post nasal drip        Assessment/     Assessment & Plan COPD with acute exacerbation (HCC)  Stage 4 very severe COPD by GOLD classification (HCC)  Nodule of lower lobe of left lung  Vaccine counseling    PLAN Patient Instructions     ICD-10-CM   1. Stage 4 very severe COPD by GOLD classification (HCC)  J44.9     2. Chronic respiratory failure with hypoxia and hypercapnia (HCC)  J96.11    J96.12     3. COPD, frequent exacerbations (HCC)  J44.1     4. Alpha-1-antitrypsin deficiency (HCC)  E88.01      #COPD exacerbation  Plan  - Take doxycycline 100mg  po twice daily x 5 days; take after meals and avoid sunlight - Please take prednisone  40 mg x1 day, then 30 mg x1 day, then 20 mg x1 day, then 10 mg x1 day, and then 5 mg x1 day and stop    #  COPD  - avanced and gold stage 4 - now with COPD flare up 07/11/2024    Plan  - - Continue Breztri  2 puffs 2 times daily - Continue oxygen 3 L at rest and 4 L with exertion to keep pulse ox greater than 88% - Continue  albuterol  as needed  - But change albuterol  HFA TO  Ventolin  - - Contine OTHUVAYRE nebulizer twice daily ( first dose 04/19/2024)   [employer] approved - Discussed transplant referral and respective desire to refuse - Normal eosinophils so no role for Dupixent or Nucala  - Respect prior reluctance to do BiPAP - Alpha-1 SS probably does not qualify for alpha-1 replacement infusion - refer pulmonary rehab    #Lung nodule left lower July 2025  - not reported on  Cardiac CT chest chest Aug 2025  Plan  - repeat CT chest in 12 month (no contrast) Aug 2026   # Chronic systolic dysfunction-ejection fraction 45% on echocardiogram July 2025  - glad you saw Dr MARLA  Plan - Per cardiology  #vaccline counseling  Plan  - CMA to document that you got the flu shot already for 2025-2026 seasn - get RSV Vaccine when better - Prevanar-20 when better  Follow-up - - 4 months with Joane Postel or APP ; 15 minutes    FOLLOWUP    Return for - - 4 months with Maxemiliano Riel or APP ; 15 minutes.    SIGNATURE    Dr. Dorethia Cave, M.D., F.C.C.P,  Pulmonary and Critical Care Medicine Staff Physician, [facility] Center Director - Interstitial Lung Disease  Program  Pulmonary Fibrosis [facility] at [facility] [place], [place], [address]  Pager: [phone], If no answer or between  15:00h - 7:00h: call [phone] Telephone: [phone]  5:51 PM 07/11/2024

## 2024-07-11 NOTE — Patient Instructions (Addendum)
 ICD-10-CM   1. Stage 4 very severe COPD by GOLD classification (HCC)  J44.9     2. Chronic respiratory failure with hypoxia and hypercapnia (HCC)  J96.11    J96.12     3. COPD, frequent exacerbations (HCC)  J44.1     4. Alpha-1-antitrypsin deficiency (HCC)  E88.01      #COPD exacerbation  Plan  - Take doxycycline 100mg  po twice daily x 5 days; take after meals and avoid sunlight - Please take prednisone  40 mg x1 day, then 30 mg x1 day, then 20 mg x1 day, then 10 mg x1 day, and then 5 mg x1 day and stop    #COPD  - avanced and gold stage 4 - now with COPD flare up 07/11/2024    Plan  - - Continue Breztri  2 puffs 2 times daily - Continue oxygen 3 L at rest and 4 L with exertion to keep pulse ox greater than 88% - Continue albuterol  as needed  - But change albuterol  HFA TO  Ventolin  - - Contine OTHUVAYRE nebulizer twice daily ( first dose 04/19/2024)   [employer] approved - Discussed transplant referral and respective desire to refuse - Normal eosinophils so no role for Dupixent or Nucala  - Respect prior reluctance to do BiPAP - Alpha-1 SS probably does not qualify for alpha-1 replacement infusion - refer pulmonary rehab    #Lung nodule left lower July 2025  - not reported on  Cardiac CT chest chest Aug 2025  Plan  - repeat CT chest in 12 month (no contrast) Aug 2026   # Chronic systolic dysfunction-ejection fraction 45% on echocardiogram July 2025  - glad you saw Dr MARLA  Plan - Per cardiology  #vaccline counseling  Plan  - CMA to document that you got the flu shot already for 2025-2026 seasn - get RSV Vaccine when better - Prevanar-20 when better  Follow-up - - 4 months with Baylee Mccorkel or APP ; 15 minutes

## 2024-07-12 ENCOUNTER — Telehealth (HOSPITAL_COMMUNITY): Payer: Self-pay

## 2024-07-12 NOTE — Telephone Encounter (Signed)
 Pt insurance is active and benefits verified through [employer]. Co-pay $15, DED $0/$0 met, out of pocket $3,400/$891.84 met, co-insurance 0%. No pre-authorization required. 07/12/2024 @ 2:57pm, spoke with Luke, REF# [id number].

## 2024-07-13 NOTE — Telephone Encounter (Signed)
 Attempted to call patient regarding pulmonary rehab- no answer, left message. Sent MyChart message.

## 2024-07-21 ENCOUNTER — Encounter: Payer: Self-pay | Admitting: Cardiology

## 2024-07-21 ENCOUNTER — Ambulatory Visit: Attending: Cardiology | Admitting: Cardiology

## 2024-07-21 VITALS — BP 100/74 | HR 108 | Ht <= 58 in | Wt 83.4 lb

## 2024-07-21 DIAGNOSIS — I420 Dilated cardiomyopathy: Secondary | ICD-10-CM

## 2024-07-21 DIAGNOSIS — I10 Essential (primary) hypertension: Secondary | ICD-10-CM

## 2024-07-21 DIAGNOSIS — J432 Centrilobular emphysema: Secondary | ICD-10-CM | POA: Diagnosis not present

## 2024-07-21 DIAGNOSIS — Z87891 Personal history of nicotine dependence: Secondary | ICD-10-CM | POA: Diagnosis not present

## 2024-07-21 DIAGNOSIS — R0789 Other chest pain: Secondary | ICD-10-CM | POA: Diagnosis not present

## 2024-07-21 NOTE — Patient Instructions (Addendum)
Medication Instructions:  Your physician recommends that you continue on your current medications as directed. Please refer to the Current Medication list given to you today.  *If you need a refill on your cardiac medications before your next appointment, please call your pharmacy*   Lab Work: BMP today If you have labs (blood work) drawn today and your tests are completely normal, you will receive your results only by: MyChart Message (if you have MyChart) OR A paper copy in the mail If you have any lab test that is abnormal or we need to change your treatment, we will call you to review the results.   Testing/Procedures: None Ordered   Follow-Up: At [facility], you and your health needs are our priority.  As part of our continuing mission to provide you with exceptional heart care, we have created designated Provider Care Teams.  These Care Teams include your primary Cardiologist (physician) and Advanced Practice Providers (APPs -  Physician Assistants and Nurse Practitioners) who all work together to provide you with the care you need, when you need it.  We recommend signing up for the patient portal called "MyChart".  Sign up information is provided on this After Visit Summary.  MyChart is used to connect with patients for Virtual Visits (Telemedicine).  Patients are able to view lab/test results, encounter notes, upcoming appointments, etc.  Non-urgent messages can be sent to your provider as well.   To learn more about what you can do with MyChart, go to [email or web address].    Your next appointment:   3 month(s)  The format for your next appointment:   In Person  Provider:   Robert Krasowski, MD    Other Instructions NA  

## 2024-07-21 NOTE — Progress Notes (Signed)
 Cardiology Office Note:    Date:  07/21/2024   ID:  Calypso Hagarty, DOB 1956/02/20, MRN [id number]  PCP:  Silvano Angeline FALCON, NP  Cardiologist:  Lamar Fitch, MD    Referring MD: Silvano Angeline FALCON, NP   Chief Complaint  Patient presents with   Follow-up    History of Present Illness:    Jodi Curtis is a 68 y.o. 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  Interestingly she was evaluated in 2022 for similar symptomatology fatigue tiredness shortness of breath atypical chest pain and evaluation showed normal ejection fraction stress test was normal.  Evaluation that I performed included coronary CT angio surprisingly her calcium score is 0 total plaque volume 0 and her coronary arteries are clean without evidence of coronary artery disease.  I have to admit I am surprised by that.  But overall prognosis better with normal coronaries with cardiomyopathy rather than coronary artery stenosis with cardiomyopathy.  She is doing fair oxygen all the time easily getting short of breath easily having heart rate speeding up and she is actually asking question what because of the I told her poor lung condition poor results that was responsible for her symptoms.  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   BMI 26.0-26.9,adult 10/25/2015   Body mass index (BMI) 19.9 or less, adult 09/09/2023   Body mass index [BMI] 19.9 or less, adultBody mass index [BMI] 19.9 or less, adult; Start Date : 05/02/2024Body mass index [BMI] 19.9 or less, adult; Start Date : 04/02/2024Body mass index [BMI] 19.9 or less, adult; Start Date : 03/05/2024Body mass index [BMI] 19.9 or less, adult; Start Date : 02/26/2024Body mass index [BMI] 19.9 or less, adult; Start Date : 10/26/2023Body mass index [BMI] 1   Body mass index (BMI) 20.0-20.9, adult 08/24/2022   Body mass index [BMI] 20.0-20.9, adultBody mass index [BMI] 20.0-20.9, adult; Start Date : 04/03/2023Body mass index [BMI] 20.0-20.9, adult; Start Date : 01/04/2023Body mass index [BMI] 20.0-20.9, adult; Start Date : 09/10/2021     CAP (community acquired pneumonia) 11/24/2022   Centrilobular emphysema (HCC) 05/22/2021   Chest CT 01/21/2021     Chest pain 01/20/2021   Chest pain, unspecifiedChest pain, unspecified; Start Date : 02/14/2019     Chest pressure 02/03/2021   Chronic pain syndrome 10/25/2015   Chronic respiratory failure with hypoxia (HCC) 05/22/2021   6-minute walk test 02/10/2021 noted oxygen desaturation     Contact with and (suspected) exposure to other viral communicable diseases 12/29/2022   Contact w and exposure to oth viral communicable diseasesContact w and exposure to oth viral communicable diseases; Start Date : 02/26/2024Contact w and exposure to oth viral communicable diseases; Start Date : 10/01/2021     COPD (chronic obstructive pulmonary disease) (HCC)    COPD with acute exacerbation (HCC) 03/14/2019   Quit  all smoking 12/2018   - alpha one AT screen 03/14/2019   SS   Level 99   - 03/14/2019    changed trelegy to symb/spiriva  smi   - 05/09/2019  After extensive coaching inhaler device,  effectiveness =    75% hfa and smi      COVID-19 10/01/2021   COVID-19COVID-19; Start Date : 09/14/2021COVID-19; Start Date :  12/30/2020COVID-19; Start Date : 09/11/2019     Dyspnea 01/20/2021   Shortness of breath     Elevated BP without diagnosis of hypertension 10/25/2015   Encounter for screening for infectious and parasitic diseases, unspecified 03/19/2020   Encounter for screening for other viral diseasesEncounter for screening for other viral diseases; Start Date : 02/26/2024Encounter for screening for other viral diseases; Start Date : 01/04/2023Encounter for screening for other viral diseases; Start Date : 12/14/2022Encounter for screening for other viral diseases; Start Date : 12/30/2020Encounter for screening for other viral diseases; Start   Exercise hypoxemia 03/14/2019   03/14/2019   desats Walking RA x one lap =  approx 250 ft - stopped due to  Sob/ desats to 84% at moderate pace      Family history of early CAD 05/15/2024   Fatigue 04/14/2021   Other fatigue     Former smoker 03/11/2020   LDCT chest ordered per Angeline York:   12/22/19 neg      Gastroesophageal reflux disease without esophagitis 02/10/2024   Hardening of the aorta (main artery of the heart) 02/10/2024   HTN (hypertension) 11/24/2022   Hypertension    Idiopathic scoliosis 10/25/2015   Impacted cerumen 09/11/2010   IMPACTED CERUMEN     Impaired cognition 02/10/2024   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/03/2014   Dermatitis, unspecified     Localized enlarged lymph nodes 10/17/2015   Localized enlarged lymph nodes     Long term current use of therapeutic drug 04/04/2019   Other long term (current) drug therapy     Low back pain 02/11/2024   Lung field abnormal 10/01/2015   Other nonspecific abnormal finding of lung field     Mucopurulent chronic bronchitis (HCC) 01/21/2023   Respiratory assist vest; azithromycin  M, W, F     Neck pain 04/08/2022   CervicalgiaCervicalgia; Start Date : 05/05/2021     Nicotine dependence 02/10/2024   Obstructive sleep apnea syndrome 02/10/2024   Onychomycosis due to dermatophyte 09/09/2023   Tinea  unguiumTinea unguium; Start Date : 05/03/2023     Osteoarthritis of carpometacarpal [facility]) joint of thumb 02/10/2024   Oxygen dependent 02/10/2024   Pain in finger of left hand 12/14/2017   Pain in left finger(s)     Pain in left arm 04/08/2022   Pain in left arm     Pain in right hand 04/08/2022   Pain in right hand     Pain of left lower extremity 09/09/2016   Pain in left leg     Pain of toe of left foot 12/14/2017   Pain in left toe(s)     Paresthesia 04/08/2022   Paresthesia of skin     Persistent cough 12/23/2015   Cough     Personal history of nicotine dependence 01/20/2021   Personal history of nicotine dependence     Posterior rhinorrhea 03/19/2020   Postnasal dripPostnasal drip; Start Date : 12/14/2017     Prediabetes 04/14/2021   Prediabetes     Primary osteoarthritis involving multiple joints 10/25/2015   Radiculopathy due to lumbar intervertebral disc disorder 04/07/2019   Intervertebral disc disorders  w radiculopathy, lumbar region     Recurrent major depression 02/10/2024   Solitary pulmonary nodule 10/17/2015   Solitary pulmonary nodule     Spondylitis 02/10/2024   Stopped smoking with greater than 30 pack year history 05/15/2024   Urinary incontinence 02/10/2024   Vitamin D deficiency 02/10/2024    Past Surgical History:  Procedure Laterality Date   CHOLECYSTECTOMY     TUBAL LIGATION      Current Medications: Current Meds  Medication Sig   albuterol  (VENTOLIN  HFA) 108 (90 Base) MCG/ACT inhaler Inhale 2 puffs into the lungs every 6 (six) hours as needed for wheezing or shortness of breath.   aspirin  EC 81 MG tablet Take 1 tablet (81 mg total) by mouth daily. Swallow whole.   BREZTRI  AEROSPHERE 160-9-4.8 MCG/ACT AERO Inhale 2 puffs into the lungs 2 (two) times daily.   busPIRone  (BUSPAR ) 10 MG tablet Take 1 tablet by mouth 2 (two) times daily.   citalopram  (CELEXA ) 40 MG tablet Take 40 mg by mouth daily.   Ensifentrine  (OHTUVAYRE  IN) Take 3 mg by  nebulization daily.   meloxicam (MOBIC) 15 MG tablet Take 15 mg by mouth daily.     Allergies:   Sulfa antibiotics, Codeine, and Penicillins   Social History   Socioeconomic History   Marital status: Married    Spouse name: Not on file   Number of children: Not on file   Years of education: Not on file   Highest education level: Not on file  Occupational History   Not on file  Tobacco Use   Smoking status: Former    Current packs/day: 0.00    Average packs/day: 1 pack/day for 30.0 years (30.0 ttl pk-yrs)    Types: Cigarettes    Start date: 01/27/1987    Quit date: 01/26/2017    Years since quitting: 7.4   Smokeless tobacco: Never  Vaping Use   Vaping status: Former   Start date: 01/26/2017   Quit date: 11/27/2018  Substance and Sexual Activity   Alcohol use: Never   Drug use: Never   Sexual activity: Never  Other Topics Concern   Not on file  Social History Narrative   Not on file   Social Drivers of Health   Financial Resource Strain: Not on file  Food Insecurity: Low Risk  (07/21/2023)   Received from [facility]   Hunger Vital Sign    Within the past 12 months, you worried that your food would run out before you got money to buy more: Never true    Within the past 12 months, the food you bought just didn't last and you didn't have money to get more. : Never true  Transportation Needs: No Transportation Needs (07/21/2023)   Received from [facility]    In the past 12 months, has lack of reliable transportation kept you from medical appointments, meetings, work or from getting things needed for daily living? : No  Physical Activity: Not on file  Stress: Not on file  Social Connections: Not on file     Family History: The patient's family history is not on file. ROS:   Please see the history of present illness.    All 14 point review of systems negative except as described per history of present illness  EKGs/Labs/Other Studies Reviewed:          Recent Labs: 12/23/2023: ALT 17; BUN 11; Creatinine, Ser 0.76; Potassium 4.0; Sodium 141 03/14/2024: Hemoglobin 12.6; Platelets 297.0; Pro B Natriuretic  peptide (BNP) 34.0  Recent Lipid Panel No results found for: CHOL, TRIG, HDL, CHOLHDL, VLDL, LDLCALC, LDLDIRECT  Physical Exam:    VS:  BP 100/74   Pulse (!) 108   Ht 4' 9 (1.448 m)   Wt 83 lb 6 oz (37.8 kg)   SpO2 97%   BMI 18.04 kg/m     Wt Readings from Last 3 Encounters:  07/21/24 83 lb 6 oz (37.8 kg)  07/11/24 87 lb 9.6 oz (39.7 kg)  05/16/24 89 lb 3.2 oz (40.5 kg)     GEN:  Well nourished, well developed in no acute distress HEENT: Normal NECK: No JVD; No carotid bruits LYMPHATICS: No lymphadenopathy CARDIAC: RRR, no murmurs, no rubs, no gallops RESPIRATORY:  Clear to auscultation without rales, wheezing or rhonchi  ABDOMEN: Soft, non-tender, non-distended MUSCULOSKELETAL:  No edema; No deformity  SKIN: Warm and dry LOWER EXTREMITIES: no swelling NEUROLOGIC:  Alert and oriented x 3 PSYCHIATRIC:  Normal affect   ASSESSMENT:    1. Dilated cardiomyopathy (HCC) ejection fraction 45% 2025   2. Centrilobular emphysema (HCC)   3. Atypical chest pain   4. Former smoker    PLAN:    In order of problems listed above:  Dilated cardiomyopathy ejection fraction 45%, she is on a beta-blocker, will check Chem-7 next Chem-7 is fine we will initiate 25 mg losartan Emphysema follow-up excellently covered by pulmonologist.  Stable advance. Atypical chest pain denies having any. History of smoking abstain from smoking now   Medication Adjustments/Labs and Tests Ordered: Current medicines are reviewed at length with the patient today.  Concerns regarding medicines are outlined above.  No orders of the defined types were placed in this encounter.  Medication changes: No orders of the defined types were placed in this encounter.   Signed, Lamar DOROTHA Fitch, MD, [facility] 07/21/2024 1:29 PM    [facility]

## 2024-07-21 NOTE — Addendum Note (Signed)
 Addended by: ARLOA PLANAS D on: 07/21/2024 01:38 PM   Modules accepted: Orders

## 2024-07-22 LAB — BASIC METABOLIC PANEL WITH GFR
BUN/Creatinine Ratio: 18 (ref 12–28)
BUN: 15 mg/dL (ref 8–27)
CO2: 30 mmol/L — ABNORMAL HIGH (ref 20–29)
Calcium: 9.5 mg/dL (ref 8.7–10.3)
Chloride: 97 mmol/L (ref 96–106)
Creatinine, Ser: 0.83 mg/dL (ref 0.57–1.00)
Glucose: 161 mg/dL — ABNORMAL HIGH (ref 70–99)
Potassium: 4.1 mmol/L (ref 3.5–5.2)
Sodium: 141 mmol/L (ref 134–144)
eGFR: 78 mL/min/1.73 (ref >59)

## 2024-07-24 ENCOUNTER — Ambulatory Visit: Payer: Self-pay | Admitting: Cardiology

## 2024-07-25 ENCOUNTER — Telehealth (HOSPITAL_COMMUNITY): Payer: Self-pay

## 2024-07-25 NOTE — Telephone Encounter (Signed)
 F/u call regarding pulmonary rehab. Spoke to patient's daughter Warren (DPR on file), went over her insurance benefits. She will talk to patient and call us  back to schedule.

## 2024-08-01 MED ORDER — LOSARTAN POTASSIUM 25 MG PO TABS: 25.0000 mg | ORAL_TABLET | Freq: Every day | ORAL | 3 refills | Status: AC

## 2024-08-01 NOTE — Telephone Encounter (Signed)
 RX sent

## 2024-08-10 ENCOUNTER — Telehealth (HOSPITAL_COMMUNITY): Payer: Self-pay

## 2024-08-10 NOTE — Telephone Encounter (Signed)
No response from pt in regards to Pulmonary Rehab. Closed referral    

## 2024-08-22 DIAGNOSIS — N959 Unspecified menopausal and perimenopausal disorder: Secondary | ICD-10-CM | POA: Insufficient documentation

## 2024-10-19 ENCOUNTER — Encounter: Payer: Self-pay | Admitting: *Deleted

## 2024-10-19 ENCOUNTER — Telehealth: Payer: Self-pay | Admitting: *Deleted

## 2024-10-19 NOTE — Telephone Encounter (Signed)
 Left detailed message patient is not under Dr. Geronimo. If anything further needed call office back. NFN Copied from CRM [id number]. Topic: Clinical - Order For Equipment >> Oct 17, 2024 11:58 AM Leila C wrote: Reason for CRM: Brianne from [facility] [phone] received auth request for oxygen concentrator and portable oxygen concentrator, needs to verify the ordering provider is Dr. Geronimo not NP, Gayla Georgi. Please advise and call back.

## 2024-10-24 ENCOUNTER — Ambulatory Visit: Admitting: Cardiology

## 2024-11-24 ENCOUNTER — Ambulatory Visit: Admitting: Cardiology

## 2025-02-05 ENCOUNTER — Ambulatory Visit: Admitting: Internal Medicine

## 2025-07-11 ENCOUNTER — Other Ambulatory Visit
